# Patient Record
Sex: Female | Born: 1955
Health system: Southern US, Community
[De-identification: ages and names within clinical notes are randomized; demographics above are authoritative.]

## PROBLEM LIST (undated history)

## (undated) DIAGNOSIS — D219 Benign neoplasm of connective and other soft tissue, unspecified: Secondary | ICD-10-CM

## (undated) DIAGNOSIS — E785 Hyperlipidemia, unspecified: Secondary | ICD-10-CM

## (undated) DIAGNOSIS — E669 Obesity, unspecified: Secondary | ICD-10-CM

## (undated) DIAGNOSIS — R87629 Unspecified abnormal cytological findings in specimens from vagina: Secondary | ICD-10-CM

## (undated) DIAGNOSIS — R7303 Prediabetes: Secondary | ICD-10-CM

## (undated) DIAGNOSIS — I10 Essential (primary) hypertension: Secondary | ICD-10-CM

## (undated) DIAGNOSIS — E119 Type 2 diabetes mellitus without complications: Secondary | ICD-10-CM

## (undated) DIAGNOSIS — O24419 Gestational diabetes mellitus in pregnancy, unspecified control: Secondary | ICD-10-CM

## (undated) HISTORY — DX: Gestational diabetes mellitus in pregnancy, unspecified control: O24.419

## (undated) HISTORY — PX: HERNIA REPAIR: SHX51

## (undated) HISTORY — DX: Type 2 diabetes mellitus without complications: E11.9

## (undated) HISTORY — PX: WISDOM TOOTH EXTRACTION: SHX21

## (undated) HISTORY — DX: Obesity, unspecified: E66.9

## (undated) HISTORY — DX: Hyperlipidemia, unspecified: E78.5

## (undated) HISTORY — DX: Benign neoplasm of connective and other soft tissue, unspecified: D21.9

## (undated) HISTORY — DX: Unspecified abnormal cytological findings in specimens from vagina: R87.629

---

## 2002-01-09 ENCOUNTER — Emergency Department (HOSPITAL_COMMUNITY): Admission: EM | Admit: 2002-01-09 | Discharge: 2002-01-09 | Payer: Self-pay | Admitting: Emergency Medicine

## 2003-07-12 ENCOUNTER — Inpatient Hospital Stay (HOSPITAL_COMMUNITY): Admission: AD | Admit: 2003-07-12 | Discharge: 2003-07-12 | Payer: Self-pay | Admitting: Obstetrics and Gynecology

## 2003-11-24 ENCOUNTER — Inpatient Hospital Stay (HOSPITAL_COMMUNITY): Admission: AD | Admit: 2003-11-24 | Discharge: 2003-11-24 | Payer: Self-pay | Admitting: *Deleted

## 2004-09-27 ENCOUNTER — Ambulatory Visit: Payer: Self-pay | Admitting: Family Medicine

## 2005-03-15 ENCOUNTER — Ambulatory Visit (HOSPITAL_COMMUNITY): Admission: RE | Admit: 2005-03-15 | Discharge: 2005-03-15 | Payer: Self-pay | Admitting: Internal Medicine

## 2007-02-18 ENCOUNTER — Encounter (INDEPENDENT_AMBULATORY_CARE_PROVIDER_SITE_OTHER): Payer: Self-pay | Admitting: Specialist

## 2007-02-18 ENCOUNTER — Inpatient Hospital Stay (HOSPITAL_COMMUNITY): Admission: EM | Admit: 2007-02-18 | Discharge: 2007-02-19 | Payer: Self-pay | Admitting: Emergency Medicine

## 2007-07-03 ENCOUNTER — Ambulatory Visit (HOSPITAL_COMMUNITY): Admission: RE | Admit: 2007-07-03 | Discharge: 2007-07-03 | Payer: Self-pay | Admitting: Obstetrics

## 2007-07-08 ENCOUNTER — Emergency Department (HOSPITAL_COMMUNITY): Admission: EM | Admit: 2007-07-08 | Discharge: 2007-07-08 | Payer: Self-pay | Admitting: Emergency Medicine

## 2007-07-25 ENCOUNTER — Inpatient Hospital Stay (HOSPITAL_COMMUNITY): Admission: RE | Admit: 2007-07-25 | Discharge: 2007-07-26 | Payer: Self-pay | Admitting: Surgery

## 2008-08-23 ENCOUNTER — Emergency Department (HOSPITAL_COMMUNITY): Admission: EM | Admit: 2008-08-23 | Discharge: 2008-08-23 | Payer: Self-pay | Admitting: Emergency Medicine

## 2009-07-27 ENCOUNTER — Ambulatory Visit (HOSPITAL_COMMUNITY): Admission: RE | Admit: 2009-07-27 | Discharge: 2009-07-27 | Payer: Self-pay | Admitting: Obstetrics

## 2010-08-03 ENCOUNTER — Ambulatory Visit (HOSPITAL_COMMUNITY): Admission: RE | Admit: 2010-08-03 | Discharge: 2010-08-03 | Payer: Self-pay | Admitting: Obstetrics

## 2011-01-05 ENCOUNTER — Encounter: Payer: Self-pay | Admitting: *Deleted

## 2011-01-05 ENCOUNTER — Emergency Department (HOSPITAL_COMMUNITY)
Admission: EM | Admit: 2011-01-05 | Discharge: 2011-01-05 | Payer: Self-pay | Source: Home / Self Care | Admitting: Emergency Medicine

## 2011-01-06 ENCOUNTER — Encounter: Payer: Self-pay | Admitting: Internal Medicine

## 2011-01-08 LAB — DIFFERENTIAL
Basophils Absolute: 0.1 10*3/uL (ref 0.0–0.1)
Basophils Relative: 1 % (ref 0–1)
Eosinophils Absolute: 0.2 10*3/uL (ref 0.0–0.7)
Eosinophils Relative: 3 % (ref 0–5)
Lymphocytes Relative: 49 % — ABNORMAL HIGH (ref 12–46)
Lymphs Abs: 3.2 10*3/uL (ref 0.7–4.0)
Monocytes Absolute: 0.5 10*3/uL (ref 0.1–1.0)
Monocytes Relative: 8 % (ref 3–12)
Neutro Abs: 2.6 10*3/uL (ref 1.7–7.7)
Neutrophils Relative %: 39 % — ABNORMAL LOW (ref 43–77)

## 2011-01-08 LAB — URINALYSIS, ROUTINE W REFLEX MICROSCOPIC
Bilirubin Urine: NEGATIVE
Hgb urine dipstick: NEGATIVE
Ketones, ur: NEGATIVE mg/dL
Nitrite: NEGATIVE
Protein, ur: NEGATIVE mg/dL
Specific Gravity, Urine: 1.007 (ref 1.005–1.030)
Urine Glucose, Fasting: NEGATIVE mg/dL
Urobilinogen, UA: 0.2 mg/dL (ref 0.0–1.0)
pH: 7 (ref 5.0–8.0)

## 2011-01-08 LAB — POCT PREGNANCY, URINE: Preg Test, Ur: NEGATIVE

## 2011-01-08 LAB — COMPREHENSIVE METABOLIC PANEL
ALT: 15 U/L (ref 0–35)
AST: 21 U/L (ref 0–37)
Albumin: 3.7 g/dL (ref 3.5–5.2)
Alkaline Phosphatase: 51 U/L (ref 39–117)
BUN: 8 mg/dL (ref 6–23)
CO2: 26 mEq/L (ref 19–32)
Calcium: 9.4 mg/dL (ref 8.4–10.5)
Chloride: 101 mEq/L (ref 96–112)
Creatinine, Ser: 0.8 mg/dL (ref 0.4–1.2)
GFR calc Af Amer: >60 mL/min (ref >60)
GFR calc non Af Amer: >60 mL/min (ref >60)
Glucose, Bld: 109 mg/dL — ABNORMAL HIGH (ref 70–99)
Potassium: 4.1 mEq/L (ref 3.5–5.1)
Sodium: 136 mEq/L (ref 135–145)
Total Bilirubin: 0.8 mg/dL (ref 0.3–1.2)
Total Protein: 8.3 g/dL (ref 6.0–8.3)

## 2011-01-08 LAB — CBC
HCT: 38.7 % (ref 36.0–46.0)
Hemoglobin: 13.3 g/dL (ref 12.0–15.0)
MCH: 29.2 pg (ref 26.0–34.0)
MCHC: 34.4 g/dL (ref 30.0–36.0)
MCV: 84.9 fL (ref 78.0–100.0)
Platelets: 317 10*3/uL (ref 150–400)
RBC: 4.56 MIL/uL (ref 3.87–5.11)
RDW: 13.4 % (ref 11.5–15.5)
WBC: 6.6 10*3/uL (ref 4.0–10.5)

## 2011-03-05 ENCOUNTER — Inpatient Hospital Stay (INDEPENDENT_AMBULATORY_CARE_PROVIDER_SITE_OTHER)
Admission: RE | Admit: 2011-03-05 | Discharge: 2011-03-05 | Disposition: A | Discharge status: Home / Self Care | Payer: BC Managed Care – PPO | Source: Ambulatory Visit | Attending: Family Medicine | Admitting: Family Medicine

## 2011-03-05 DIAGNOSIS — R6889 Other general symptoms and signs: Secondary | ICD-10-CM

## 2011-04-30 NOTE — Op Note (Signed)
NAME:  Mackenzie Nichols, Mackenzie Nichols               ACCOUNT NO.:  [id number]   MEDICAL RECORD NO.:  [id number]          PATIENT TYPE:  OIB   LOCATION:  1529                         FACILITY:  [facility]   PHYSICIAN:  Sandria Bales. Ezzard Standing, M.D.  DATE OF BIRTH:  03-19-1956   DATE OF PROCEDURE:  07/24/2007  DATE OF DISCHARGE:                               OPERATIVE REPORT   PREOP DIAGNOSIS:  Incarcerated recurrent ventral hernia.   POSTOP DIAGNOSIS:  Incarcerated recurrent ventral hernia approximately 6  centimeters, Swiss cheese defect.   PROCEDURE:  Laparoscopic repair of ventral hernia with 12 centimeter  circular Parietex mesh.   SURGEON:  Dr. Ezzard Standing.   No first assistant.   ANESTHESIA:  General with about 15 mL of 1% Xylocaine.   COMPLICATIONS:  None.   INDICATIONS FOR PROCEDURE:  Ms. Burnette is an obese 55 year old black  female who presented in March of 2008 with an incarcerated  epigastric/ventral hernia that was repaired in an open fashion.  However, within 3 months of surgery she had a recurrent hernia.  During  her initial surgery because of concern about injury to bowel, I was  unable to place a piece of mesh in and just closed her primarily with  sutures.   She now comes back for a repair of this hernia, this time a laparoscopic  attempt and my plan is to place mesh.   The indications and potential complications explained to the patient.  Potential complications include, but are not limited to, bleeding,  infection, bowel injury, possibly a recurrence of hernia and the  possibility of open hernia surgery.   OPERATIVE NOTE:  The patient placed in the supine position, given a  general endotracheal anesthetic and a time-out was held identifying the  patient and the procedure.   She was given 1 g of Ancef at the initiation of the procedure, she had a  Foley catheter in place, PES stockings in place.   I accessed the abdominal cavity through a left upper quadrant Optiview  going into the  abdominal cavity and placed a 5 mm in the left lower  quadrant and a 5 mm in the right lateral abdominal cavity.  I then did  an abdominal exploration which her abdomen was actually fairly clear,  both lobes of the liver were unremarkable, the stomach unremarkable.  She did have a fibroid appearing uterus.  She did also have incarcerated  abdominal wall hernia with multiple Swiss cheese defects, probably 6 or  8 defects, that covered an area about 6 cm.   I thought this defect could easily be covered with a 12 cm Parietex  mesh.   So, I placed 8 sutures circumferentially on the Parietex mesh, inserted  the mesh into the abdominal cavity and sewed this to the anterior  abdominal wall using Endoclose to catch the suture.  The mesh covered  the entire defect.  I then used the staples, about 25 staples, to staple  on the edge of the mesh at 1 cm intervals and then I put 2 or 3 staples  in the middle of the mesh as a  tenting effect.   At the end of the procedure I took pictures of the mesh, it looked like  the mesh covered the hernia defect well, there were no defects along the  edge of the mesh, there was no bleeding from the omentum that I reduced.  I then removed the trocars in turn.  Then each trocar was closed with a  #4-0 Monocryl suture, each wound painted with Tincture of benzoin and  Steri-stripped.  Again, there were 8 wounds for the sutures for the mesh  and then the three other wounds for the camera ports.   The patient tolerated the procedure well, was transported to the  recovery room in good condition.      Sandria Bales. Ezzard Standing, M.D.  Electronically Signed     DHN/MEDQ  D:  07/24/2007  T:  07/24/2007  Job:  [id number]   cc:   Kari Baars, M.D.  Fax: [phone]

## 2011-04-30 NOTE — Discharge Summary (Signed)
NAME:  Mackenzie Nichols, Mackenzie Nichols               ACCOUNT NO.:  [id number]   MEDICAL RECORD NO.:  [id number]          PATIENT TYPE:  WOC   LOCATION:  WOC                          FACILITY:  [facility]   PHYSICIAN:  Sandria Bales. Ezzard Standing, M.D.  DATE OF BIRTH:  [birth year]   DATE OF ADMISSION:  DATE OF DISCHARGE:                               DISCHARGE SUMMARY   DISCHARGE DIAGNOSES:  1. Incarcerated ventral hernia.  2. Obesity.   OPERATIONS PERFORMED:  Laparoscopic ventral incisional hernia repair on  July 24, 2007.   HISTORY OF PRESENT ILLNESS:  This is a 55 year old black female whose a  patient of Dr. Eric Form who in March 2008 presented with an acute  incarcerated ventral hernia. This hernia was repaired. She had bowel  entrapped in the hernia and therefore I felt uncomfortable placing mesh  in the hernia. Within 3 or 4 months, she has evidence of recurrent  hernia with increasing abdominal mass which is increasingly tender. I  discussed with her about returning to the operating room for a  laparoscopic repair this time with mesh of this hernia.   PAST MEDICAL HISTORY:  Really insignificant except for her weight  problems.   HOSPITAL COURSE:  On July 24, 2007, she presented to the [facility]  operating room where I did a laparoscopic ventral hernia repair for an  incarcerated ventral hernia with a 12 cm circular piece of Parietex  mesh.   Postoperatively she did well. She had some nausea, was not quite ready  to go home the first day and is now  2 days postop. She is doing better,  still though sore. Her lungs were clear to auscultation. Her abdomen had  a few bowel sounds, her pulse was 69.   She will be discharged home.  She can shower later tonight.  She should be on a low fat diet.  Probably ought to stay actually on a light diet or liquids for a day or  two until her appetite returns.  She should not drive for 3-5 days and I have written her out of work  until September 15 which is  approximately 5-6 weeks postop.   She was given Vicodin for pain and can resume any other medicine she is  on.   CONDITION ON DISCHARGE:  Good.      Sandria Bales. Ezzard Standing, M.D.  Electronically Signed     DHN/MEDQ  D:  07/26/2007  T:  07/27/2007  Job:  [id number]   cc:   Kari Baars, M.D.  Fax: [phone]

## 2011-05-03 NOTE — H&P (Signed)
NAME:  Mackenzie Nichols, Mackenzie Nichols               ACCOUNT NO.:  [id number]   MEDICAL RECORD NO.:  [id number]         PATIENT TYPE:  LINP   LOCATION:                               FACILITY:  [facility]   PHYSICIAN:  Mackenzie Nichols, M.D.  DATE OF BIRTH:  1956-03-03   DATE OF ADMISSION:  02/18/2007  DATE OF DISCHARGE:                              HISTORY & PHYSICAL   REASON FOR ADMISSION:  Abdominal pain/hernia.   HISTORY OF PRESENT ILLNESS:  This is a 55 year old black female who is a  patient of Dr. Eric Nichols who was in otherwise good health except for  about a year's time when she has had on and off vomiting, has noticed a  mass in her abdominal wall.  I do not think any further medical  attention has been paid to this mass.  I am not sure how much of her  abdominal complaints she had brought to medical attention.  She does  take some Prilosec for indigestion/reflux.  She has had no history of  peptic ulcer disease, liver disease, pancreatic disease, colon disease.  She has never had an upper or lower endoscopy.  Her only prior abdominal  surgery was a tubal ligation in 1997.   ALLERGIES:  No known drug allergies.   MEDICATIONS:  Prilosec.   REVIEW OF SYSTEMS:  NEUROLOGICAL:  No history of seizures or loss of  consciousness.  PULMONARY:  She does not smoke cigarettes.  No history of pneumonia,  tuberculosis.  CARDIAC:  No history of heart disease, chest pain, hypertension.  She  has had no prior cardiac catheterization.  GASTROINTESTINAL:  See history of present illness.  UROLOGIC:  No history of kidney stones or kidney infections.  MUSCULOSKELETAL:  She has had no orthopedic problems.   SOCIAL HISTORY:  She is married.  Her husband is a Naval architect.  He is  on the road right now.  Her second daughter, Mackenzie Nichols is in the room  with her.  She has six children.  She works at a [employer]   PHYSICAL EXAMINATION:  VITAL SIGNS:  Temperature 97, blood pressure  147/91, pulse 74,  respirations 20.  GENERAL:  Unremarkable.  She is a moderately overweight black female,  but pleasant, without significant symptoms.  HEENT:  Unremarkable.  NECK:  Supple.  No mass or thyromegaly.  LUNGS:  Symmetric breath sounds.  Clear to auscultation.  HEART:  Regular rate and rhythm.  I hear no murmur or rub.  I did not  examine her breasts.  She has had a mammogram within the last year.  ABDOMEN:  She has approximately a 4 cm mass which is about 2-3 cm above  her umbilicus.  This is consistent with an incarcerated hernia.  I feel  no other mass or nodule, but she is somewhat overweight.  She has no  other abdominal scars or organomegaly.  EXTREMITIES:  She has good strength in all four extremities.  NEUROLOGICAL:  Grossly intact.   LABORATORY DATA:  White blood cell count of 10,600, 73% neutrophils,  hemoglobin 13, hematocrit 38.  Urinalysis unremarkable.  Sodium 137,  potassium 3.8, chloride 102, CO2 28, glucose 122, BUN 5.   STUDIES:  CT scan read by Dr. Maryclare Nichols shows an incarcerated epigastric  hernia.  He thought there was bowel in it though I am not entirely sure.  There is also question of a right lower lung nodule.  She will need  follow up CT probably in six months.  I discussed this with her and her  daughter.   IMPRESSION:  1. Incarcerated ventral hernia with possible bowel.  I plan to repair      this hernia.  I discussed with the patient and her daughter      indications and complications of repair of this abdominal wall      hernia.  Potential complications not limited to bleeding,      infection, bowel resection and possible recurrence of the hernia.  We are going to repair this ventral hernia at this time.  1. Questionable right lower lung nodule.  She will need to follow up      in six months.  I discussed with the patient and her daughter and      they understand the need for followup.  2. Moderate obesity.      Mackenzie Nichols, M.D.  Electronically  Signed     DHN/MEDQ  D:  02/18/2007  T:  02/18/2007  Job:  [id number]   cc:   Mackenzie Nichols, M.D.  Fax: [phone]

## 2011-05-03 NOTE — Op Note (Signed)
NAME:  Mackenzie Nichols, Mackenzie Nichols               ACCOUNT NO.:  [id number]   MEDICAL RECORD NO.:  [id number]         PATIENT TYPE:  LINP   LOCATION:                               FACILITY:  [facility]   PHYSICIAN:  Sandria Bales. Ezzard Standing, M.D.  DATE OF BIRTH:  27-Oct-1956   DATE OF PROCEDURE:  02/18/2007  DATE OF DISCHARGE:                               OPERATIVE REPORT   PREOPERATIVE DIAGNOSIS:  Incarcerated epigastric hernia.   POSTOPERATIVE DIAGNOSIS:  Small bowel incarcerated in a ventral  epigastric hernia.  Uterine fibroids.   OPERATION PERFORMED:  Open repair of incarcerated ventral epigastric  hernia.   SURGEON:  Sandria Bales. Ezzard Standing, M.D.   ASSISTANT:  None.   ANESTHESIA:  General endotracheal.   ESTIMATED BLOOD LOSS:  50 mL.   DRAINS:  None.   INDICATIONS FOR PROCEDURE:  [name] is a 55 year old black female who  has had an abdominal mass with on and off pain in her groin almost for  year.  She has presented with a more acute nausea and vomiting and  abdominal pain.  A CT scan suggested incarcerated ventral hernia with  bowel.  She now comes for repair of this hernia.  Indications and  potential complications of hernia repair were explained to the patient  and her daughter.   The potential complications include but not limited to bleeding,  infection, bowel injury and possibility of recurrence of hernia.   DESCRIPTION OF PROCEDURE:  Patient placed in the supine position with  general endotracheal anesthetic as supervised by Jenelle Mages. Rica Mast,  M.D.  She was given 1 g of Ancef at initiation of procedure.  Her  abdomen was prepped with Betadine solution and sterilely draped.  This  hernia was about 2 to 3 cm above the umbilicus so I cut down on the  hernia sac.  I opened the hernia sac.  She does have a knuckle of small  bowel incarcerated in the hernia which I reduced.   The bowel, though beat up, was viable and did not require any resection.  She also had some omentum stuck up beside  this hernia and actually it  was a small umbilical hernia.  I took the umbilical skin off the  umbilical hernia.  The hernia defect was about 2 cm.  Because of the  questionability of the bowel, I was a little hesitant to put a piece of  mesh in.  Therefore, I just closed this primarily with interrupted #1  Novofil sutures.  I used about six of these in a transverse closure.   I then infiltrated the fascia and skin with about 20 mL of 0.25%  Marcaine.  I closed the subcutaneous tissues with 3-0 Vicryl suture and  the skin with a 5-0 Monocryl suture, painted the wounds with tincture of  benzoin and steri-stripped.  Sponge and needle count were correct at the  end of the case.   Also of note, the patient had a large uterus with multiple fibroids  which were palpable but appeared to be entirely benign.      Sandria Bales. Ezzard Standing, M.D.  Electronically  Signed     DHN/MEDQ  D:  02/18/2007  T:  02/18/2007  Job:  [id number]   cc:   Kari Baars, M.D.  Fax: [phone]

## 2011-08-15 ENCOUNTER — Other Ambulatory Visit: Payer: Self-pay | Admitting: Obstetrics

## 2011-08-15 DIAGNOSIS — Z1231 Encounter for screening mammogram for malignant neoplasm of breast: Secondary | ICD-10-CM

## 2011-08-29 ENCOUNTER — Ambulatory Visit (HOSPITAL_COMMUNITY): Payer: No Typology Code available for payment source | Attending: Obstetrics

## 2011-09-30 LAB — DIFFERENTIAL
Basophils Absolute: 0.1
Basophils Absolute: 0.1
Basophils Relative: 1
Eosinophils Absolute: 0.2
Eosinophils Relative: 2
Lymphs Abs: 3
Monocytes Absolute: 0.6
Neutrophils Relative %: 65

## 2011-09-30 LAB — COMPREHENSIVE METABOLIC PANEL
ALT: 14
AST: 16
Albumin: 3.3 — ABNORMAL LOW
Alkaline Phosphatase: 51
BUN: 7
CO2: 29
Calcium: 9.1
Chloride: 106
Creatinine, Ser: 0.76
GFR calc Af Amer: >60
GFR calc non Af Amer: >60
Glucose, Bld: 108 — ABNORMAL HIGH
Potassium: 4.1
Sodium: 141
Total Bilirubin: 0.2 — ABNORMAL LOW
Total Protein: 7.5

## 2011-09-30 LAB — BASIC METABOLIC PANEL
BUN: 11
Creatinine, Ser: 0.76
GFR calc non Af Amer: >60

## 2011-09-30 LAB — URINALYSIS, ROUTINE W REFLEX MICROSCOPIC
Glucose, UA: NEGATIVE
Leukocytes, UA: NEGATIVE
Nitrite: NEGATIVE
Protein, ur: NEGATIVE
Urobilinogen, UA: 0.2

## 2011-09-30 LAB — CBC
HCT: 36.5
Hemoglobin: 12.5
MCHC: 34.3
MCV: 84.9
MCV: 85.3
Platelets: 391
Platelets: 398
RBC: 4.3
RDW: 12.3
WBC: 8.7
WBC: 11.3 — ABNORMAL HIGH

## 2011-09-30 LAB — URINE MICROSCOPIC-ADD ON

## 2011-09-30 LAB — PREGNANCY, URINE: Preg Test, Ur: NEGATIVE

## 2011-11-26 ENCOUNTER — Ambulatory Visit (HOSPITAL_COMMUNITY): Payer: Self-pay | Attending: Obstetrics

## 2011-12-23 ENCOUNTER — Ambulatory Visit (HOSPITAL_COMMUNITY): Payer: BC Managed Care – PPO

## 2012-01-17 ENCOUNTER — Ambulatory Visit (HOSPITAL_COMMUNITY)
Admission: RE | Admit: 2012-01-17 | Discharge: 2012-01-17 | Disposition: A | Discharge status: Home / Self Care | Payer: BC Managed Care – PPO | Source: Ambulatory Visit | Attending: Obstetrics | Admitting: Obstetrics

## 2012-01-17 DIAGNOSIS — Z1231 Encounter for screening mammogram for malignant neoplasm of breast: Secondary | ICD-10-CM | POA: Insufficient documentation

## 2012-09-16 ENCOUNTER — Ambulatory Visit (INDEPENDENT_AMBULATORY_CARE_PROVIDER_SITE_OTHER): Payer: PRIVATE HEALTH INSURANCE | Admitting: Family Medicine

## 2012-09-16 ENCOUNTER — Encounter: Payer: Self-pay | Admitting: Family Medicine

## 2012-09-16 VITALS — BP 134/72 | HR 66 | Temp 98.2°F | Ht 62.0 in | Wt 238.0 lb

## 2012-09-16 DIAGNOSIS — E669 Obesity, unspecified: Secondary | ICD-10-CM

## 2012-09-16 DIAGNOSIS — Z6841 Body Mass Index (BMI) 40.0 and over, adult: Secondary | ICD-10-CM

## 2012-09-16 LAB — COMPREHENSIVE METABOLIC PANEL
BUN: 11 mg/dL (ref 6–23)
CO2: 27 mEq/L (ref 19–32)
Creat: 0.8 mg/dL (ref 0.50–1.10)
Glucose, Bld: 101 mg/dL — ABNORMAL HIGH (ref 70–99)
Total Bilirubin: 0.5 mg/dL (ref 0.3–1.2)

## 2012-09-16 LAB — CBC
HCT: 35.9 % — ABNORMAL LOW (ref 36.0–46.0)
MCH: 29.1 pg (ref 26.0–34.0)
MCV: 83.5 fL (ref 78.0–100.0)
Platelets: 371 10*3/uL (ref 150–400)
RDW: 14.4 % (ref 11.5–15.5)

## 2012-09-16 LAB — LIPID PANEL
Cholesterol: 244 mg/dL — ABNORMAL HIGH (ref 0–200)
HDL: 48 mg/dL (ref >39)
Total CHOL/HDL Ratio: 5.1 Ratio
Triglycerides: 93 mg/dL (ref <150)
VLDL: 19 mg/dL (ref 0–40)

## 2012-09-16 NOTE — Patient Instructions (Addendum)
It has been a pleasure to see you today. I will call you with the labs results if they come back abnormal  Remember what we have talked about diet and exercise. Make your next appointment in one month

## 2012-09-17 ENCOUNTER — Encounter: Payer: Self-pay | Admitting: Family Medicine

## 2012-09-17 DIAGNOSIS — Z6841 Body Mass Index (BMI) 40.0 and over, adult: Secondary | ICD-10-CM | POA: Insufficient documentation

## 2012-09-17 NOTE — Progress Notes (Signed)
  Subjective:    Patient ID: Mackenzie Nichols, female    DOB: 1956/03/16, 56 y.o.   MRN: [id number]  HPI Pt comes today to establish care in our clinic. She has no current complaints but is open to talk about her obesity. She has been obese for a long time and currently not trying to lose weight. Her diet is not "the healthiest" as she states. She also does not exercise on regular bases but tries to keep herself active at work and at home. She does not take any medications but multivitamins every other day. Denies history of tobacco, alcohol or drugs abuse.  Woman's health: LMP 04/2012. No symptoms of menopause. Pt sexually active with no birth control plans. Declines talking about the topic.  Health maintenance: Last pap smear was this year and it was done at Dr. Frederik Pear office with negative results per pt's report. Denies Flu shot and Tdap at this time.   Review of Systems Denies SOB, chest pain, palpitations, headaches, dizziness, numbness, tingling or weakness.     Objective:   Physical Exam Gen:  NAD morbidly obese pt. HEENT: Moist mucous membranes.  CV: Regular rate and rhythm, no murmurs rubs or gallops PULM: Clear to auscultation bilaterally. No wheezes/rales/rhonchi ABD: Soft, non tender, normal bowel sounds. EXT: No edema Neuro: Alert and oriented x3. No focalization      Assessment & Plan:

## 2012-09-17 NOTE — Assessment & Plan Note (Addendum)
Pt very open and positive about life style modifications in order to lose weight. Plan: Will obtain cmet, cbc, tsh and lipid profile to evaluate pt for metabolic syndrome. Life style modifications. Discussed healthy diet and exercise regimen.  Goal for now is to maintain weight and do not continue increasing. Will focus on losing weight once this is achieved. BP borderline elevated. Instructed pt to keep a log and will reassess during next visit. F/u in a month.

## 2013-01-18 ENCOUNTER — Other Ambulatory Visit: Payer: Self-pay | Admitting: Obstetrics

## 2013-01-18 DIAGNOSIS — Z1231 Encounter for screening mammogram for malignant neoplasm of breast: Secondary | ICD-10-CM

## 2013-01-27 ENCOUNTER — Ambulatory Visit (HOSPITAL_COMMUNITY): Payer: Medicaid Other

## 2013-02-05 ENCOUNTER — Ambulatory Visit (HOSPITAL_COMMUNITY)
Admission: RE | Admit: 2013-02-05 | Discharge: 2013-02-05 | Disposition: A | Discharge status: Home / Self Care | Payer: Medicaid Other | Source: Ambulatory Visit | Attending: Obstetrics | Admitting: Obstetrics

## 2013-02-05 DIAGNOSIS — Z1231 Encounter for screening mammogram for malignant neoplasm of breast: Secondary | ICD-10-CM | POA: Insufficient documentation

## 2013-06-09 ENCOUNTER — Ambulatory Visit: Payer: No Typology Code available for payment source | Admitting: Family Medicine

## 2013-06-09 ENCOUNTER — Telehealth: Payer: Self-pay | Admitting: *Deleted

## 2013-06-09 NOTE — Telephone Encounter (Signed)
Pt called and appointment rescheduled for July 2 at 4:15 Wyatt Haste, RN-BSN

## 2013-06-16 ENCOUNTER — Ambulatory Visit (INDEPENDENT_AMBULATORY_CARE_PROVIDER_SITE_OTHER): Payer: PRIVATE HEALTH INSURANCE | Admitting: Family Medicine

## 2013-06-16 ENCOUNTER — Encounter: Payer: Self-pay | Admitting: Family Medicine

## 2013-06-16 VITALS — BP 136/86 | HR 84 | Temp 98.3°F | Wt 239.0 lb

## 2013-06-16 DIAGNOSIS — E7800 Pure hypercholesterolemia, unspecified: Secondary | ICD-10-CM

## 2013-06-16 DIAGNOSIS — N926 Irregular menstruation, unspecified: Secondary | ICD-10-CM

## 2013-06-16 DIAGNOSIS — N939 Abnormal uterine and vaginal bleeding, unspecified: Secondary | ICD-10-CM

## 2013-06-16 DIAGNOSIS — R739 Hyperglycemia, unspecified: Secondary | ICD-10-CM | POA: Insufficient documentation

## 2013-06-16 DIAGNOSIS — Z6841 Body Mass Index (BMI) 40.0 and over, adult: Secondary | ICD-10-CM

## 2013-06-16 DIAGNOSIS — R7309 Other abnormal glucose: Secondary | ICD-10-CM

## 2013-06-16 NOTE — Assessment & Plan Note (Signed)
Fasting glucose 101. P/ A1C

## 2013-06-16 NOTE — Assessment & Plan Note (Addendum)
AUB in postmenopausal pt with increased in uterine size. P/ Discussed with pt in depth causes and questions answered. Endometrial biopsy (discussed with pt and recommended to make an appointment for this procedure; at that point pap smear will be obtained as well per pt's preference) Pelvic/transvaginal ultrasound indicted to evaluate endometrial stripe and uterine morphology since size is a concern.

## 2013-06-16 NOTE — Assessment & Plan Note (Signed)
Same weight. Pt reports hx of thyroid issues. TSH last year was wnl. Will repeat with there rest of her labs. Concern for metabolic syndrome. Will continue to f/u.

## 2013-06-16 NOTE — Assessment & Plan Note (Addendum)
Per labs obtained in October last year. Pt obese. AA. No other risk factors. ASCVD risk is 5.4 pt also with mild hyperglycemia but no DM diagnosed yet. P/ No indicated statins yet Recommended life style modifications. Will check A1C

## 2013-06-16 NOTE — Progress Notes (Signed)
Family Medicine Office Visit Note   Subjective:   Patient ID: Mackenzie Nichols, female  DOB: Nov 11, 1956, 57 y.o.. MRN: [id number]   Pt that comes today for annual exam. She was last seen in October/2013 for her first appointment, was instructed to f/u in a month which she never did. Today she complaints of abnormal vaginal bleeding happening the 2nd week of last month. Her LMP was reported to be in May 2013. Since then pt denies any episodic vaginal spotting/bleeding until this time. Her last vag bleeding lasted for 4-5 days and it was reported to be very heavy. Pt mentions she had a Pelvic U/S done 3 years ago that showed 7 fibroids and Dr. Quintella Reichert recommended her to f/u if symptomatic.   She also reports having "thyroid problems" in the past but has never been on a medication for it. Denies SOB, chest pain, palpitations, headaches, dizziness, numbness or weakness. No changes on urinary or BM habits. No unintentional weigh loss.  Review of Systems:  Per HPI   Objective:   Physical Exam: Gen:  Obese pt NAD HEENT: Moist mucous membranes  CV: Regular rate and rhythm, no murmurs rubs or gallops PULM: Clear to auscultation bilaterally. No wheezes/rales/rhonchi ABD: Soft, non tender, non distended, normal bowel sounds. EXT: No edema Neuro: Alert and oriented x3. No  Vulva and perianal area: Normal  Bimanual exam: Uterus anteverted, increased in size (up to umbilicus). No adnexal masses palpated. No cervical motion tenderness.  Assessment & Plan:

## 2013-06-16 NOTE — Patient Instructions (Addendum)
Abnormal Uterine Bleeding Abnormal uterine bleeding can have many causes. Some cases are simply treated, while others are more serious. There are several kinds of bleeding that is considered abnormal, including:  Bleeding between periods.  Bleeding after sexual intercourse.  Spotting anytime in the menstrual cycle.  Bleeding heavier or more than normal.  Bleeding after menopause. CAUSES  There are many causes of abnormal uterine bleeding. It can be present in teenagers, pregnant women, women during their reproductive years, and women who have reached menopause. Your caregiver will look for the more common causes depending on your age, signs, symptoms and your particular circumstance. Most cases are not serious and can be treated. Even the more serious causes, like cancer of the female organs, can be treated adequately if found in the early stages. That is why all types of bleeding should be evaluated and treated as soon as possible. DIAGNOSIS  Diagnosing the cause may take several kinds of tests. Your caregiver may:  Take a complete history of the type of bleeding.  Perform a complete physical exam and Pap smear.  Take an ultrasound on the abdomen showing a picture of the female organs and the pelvis.  Inject dye into the uterus and Fallopian tubes and X-ray them (hysterosalpingogram).  Place fluid in the uterus and do an ultrasound (sonohysterogrqphy).  Take a CT scan to examine the female organs and pelvis.  Take an MRI to examine the female organs and pelvis. There is no X-ray involved with this procedure.  Look inside the uterus with a telescope that has a light at the end (hysteroscopy).  Scrap the inside of the uterus to get tissue to examine (Dilatation and Curettage, D&C).  Look into the pelvis with a telescope that has a light at the end (laparoscopy). This is done through a very small cut (incision) in the abdomen. TREATMENT  Treatment will depend on the cause of the  abnormal bleeding. It can include:  Doing nothing to allow the problem to take care of itself over time.  Hormone treatment.  Birth control pills.  Treating the medical condition causing the problem.  Laparoscopy.  Major or minor surgery  Destroying the lining of the uterus with electrical currant, laser, freezing or heat (uterine ablation). HOME CARE INSTRUCTIONS   Follow your caregiver's recommendation on how to treat your problem.  See your caregiver if you missed a menstrual period and think you may be pregnant.  If you are bleeding heavily, count the number of pads/tampons you use and how often you have to change them. Tell this to your caregiver.  Avoid sexual intercourse until the problem is controlled. SEEK MEDICAL CARE IF:   You have any kind of abnormal bleeding mentioned above.  You feel dizzy at times.  You are 57 years old and have not had a menstrual period yet. SEEK IMMEDIATE MEDICAL CARE IF:   You pass out.  You are changing pads/tampons every 15 to 30 minutes.  You have belly (abdominal) pain.  You have a temperature of 100 F (37.8 C) or higher.  You become sweaty or weak.  You are passing large blood clots from the vagina.  You start to feel sick to your stomach (nauseous) and throw up (vomit). Document Released: 12/02/2005 Document Revised: 02/24/2012 Document Reviewed: 04/27/2009 [facility] Patient Information 2014 [place], [place].  It has been a pleasure to see you today. Please make an appointment here with me for endometrial  Biopsy, at that point we will do the pap smear as  we agreed on. Also I have placed orders for labs for you. Make a lab appointment and come in fasting that morning. I have placed an order for pelvic ultrasound. You will receive a call with date of appointment.

## 2013-06-17 ENCOUNTER — Telehealth: Payer: Self-pay | Admitting: *Deleted

## 2013-06-17 NOTE — Telephone Encounter (Signed)
LMOVM for pt to return call.  Please inform of the below:  Ultrasound appt details: Date: 06/24/13 @ 10:45am Location: [facility] ultrasound depatrtment Instructions:  Please drink 32oz of water 1 hour before your appt. DO NOT go to the restroom.  You must have a full bladder for this procedure. Megha Agnes, Maryjo Rochester

## 2013-06-24 ENCOUNTER — Ambulatory Visit (HOSPITAL_COMMUNITY)
Admission: RE | Admit: 2013-06-24 | Discharge: 2013-06-24 | Disposition: A | Discharge status: Home / Self Care | Payer: Medicaid Other | Source: Ambulatory Visit | Attending: Family Medicine | Admitting: Family Medicine

## 2013-06-24 DIAGNOSIS — D259 Leiomyoma of uterus, unspecified: Secondary | ICD-10-CM | POA: Insufficient documentation

## 2013-06-24 DIAGNOSIS — N852 Hypertrophy of uterus: Secondary | ICD-10-CM | POA: Insufficient documentation

## 2013-06-24 DIAGNOSIS — N939 Abnormal uterine and vaginal bleeding, unspecified: Secondary | ICD-10-CM

## 2013-06-24 DIAGNOSIS — N950 Postmenopausal bleeding: Secondary | ICD-10-CM | POA: Insufficient documentation

## 2013-06-25 ENCOUNTER — Other Ambulatory Visit (INDEPENDENT_AMBULATORY_CARE_PROVIDER_SITE_OTHER): Payer: PRIVATE HEALTH INSURANCE

## 2013-06-25 DIAGNOSIS — R7309 Other abnormal glucose: Secondary | ICD-10-CM

## 2013-06-25 DIAGNOSIS — R739 Hyperglycemia, unspecified: Secondary | ICD-10-CM

## 2013-07-06 ENCOUNTER — Encounter: Payer: Self-pay | Admitting: Family Medicine

## 2013-07-06 ENCOUNTER — Other Ambulatory Visit (HOSPITAL_COMMUNITY)
Admission: RE | Admit: 2013-07-06 | Discharge: 2013-07-06 | Disposition: A | Discharge status: Home / Self Care | Payer: Medicaid Other | Source: Ambulatory Visit | Attending: Family Medicine | Admitting: Family Medicine

## 2013-07-06 ENCOUNTER — Ambulatory Visit (INDEPENDENT_AMBULATORY_CARE_PROVIDER_SITE_OTHER): Payer: Medicaid Other | Admitting: Family Medicine

## 2013-07-06 VITALS — BP 144/79 | HR 104 | Temp 98.1°F | Ht 62.0 in | Wt 235.3 lb

## 2013-07-06 DIAGNOSIS — R739 Hyperglycemia, unspecified: Secondary | ICD-10-CM

## 2013-07-06 DIAGNOSIS — N938 Other specified abnormal uterine and vaginal bleeding: Secondary | ICD-10-CM

## 2013-07-06 DIAGNOSIS — Z1151 Encounter for screening for human papillomavirus (HPV): Secondary | ICD-10-CM | POA: Insufficient documentation

## 2013-07-06 DIAGNOSIS — N926 Irregular menstruation, unspecified: Secondary | ICD-10-CM

## 2013-07-06 DIAGNOSIS — N939 Abnormal uterine and vaginal bleeding, unspecified: Secondary | ICD-10-CM

## 2013-07-06 DIAGNOSIS — R7309 Other abnormal glucose: Secondary | ICD-10-CM

## 2013-07-06 DIAGNOSIS — Z124 Encounter for screening for malignant neoplasm of cervix: Secondary | ICD-10-CM

## 2013-07-06 DIAGNOSIS — Z01419 Encounter for gynecological examination (general) (routine) without abnormal findings: Secondary | ICD-10-CM | POA: Insufficient documentation

## 2013-07-06 DIAGNOSIS — N949 Unspecified condition associated with female genital organs and menstrual cycle: Secondary | ICD-10-CM

## 2013-07-06 NOTE — Progress Notes (Signed)
Family Medicine Office Visit Note   Subjective:   Patient ID: Mackenzie Nichols, female  DOB: 01-26-1956, 57 y.o.. MRN: [id number]   Pt come today to review labs and follow up her recent DUB. She denies any more vaginal bleeding that the one described on her prior visit.  U/S and chemistry was reviewed and discussed with pt. Pap smear was obtained today.  Review of Systems:  Pt denies SOB, chest pain, palpitations, headaches, dizziness, numbness or weakness. No changes on urinary or BM habits. No unintentional weigh loss/gain.  Objective:   Physical Exam: Gen:  NAD HEENT: Moist mucous membranes  CV: Regular rate and rhythm, no murmurs rubs or gallops PULM: Clear to auscultation bilaterally. No wheezes/rales/rhonchi ABD: Soft, non tender, non distended, normal bowel sound EXT: No edema Neuro: Alert and oriented x3. No focalization Vulva and perianal area: Normal  Speculum: Vagina and cervix of normal appearance, no friability, no discharge. Bimanual exam: Uterus increased in size unchanged from prior visit's exam. No cervical motion tenderness.  Assessment & Plan:

## 2013-07-06 NOTE — Assessment & Plan Note (Addendum)
A1C at 6.3. High risk for future DM. Diet and exercise regimen discussed. Pt positive to make life style modifications. F/u in 3-4 months.

## 2013-07-06 NOTE — Assessment & Plan Note (Signed)
Multiple fibromas per ultrasound. Discussed with attending Dr. Jennette Kettle. We were planning for endometrial biopsy but with this result pt needs GYN evaluation and most likely surgical treatment. Pt has not have any more bleeding since her last appointment here.  She reports has seen Dr. Quintella Reichert in the past but she owes money to his practice so she will try to resolved this financial issues with his practice first. Pt now on Medicaid that will end in 1 week. Recommended to apply for [employer] and instructed to get paperwork needed to complete.  Close follow up. Discussed signs of worsening condition that should prompt re-evaluation.

## 2013-07-06 NOTE — Patient Instructions (Addendum)
It has been a pleasure to see you today.' A referral for Gynecology has been placed today DASH Diet The DASH diet stands for "Dietary Approaches to Stop Hypertension." It is a healthy eating plan that has been shown to reduce high blood pressure (hypertension) in as little as 14 days, while also possibly providing other significant health benefits. These other health benefits include reducing the risk of breast cancer after menopause and reducing the risk of type 2 diabetes, heart disease, colon cancer, and stroke. Health benefits also include weight loss and slowing kidney failure in patients with chronic kidney disease.  DIET GUIDELINES  Limit salt (sodium). Your diet should contain less than 1500 mg of sodium daily.  Limit refined or processed carbohydrates. Your diet should include mostly whole grains. Desserts and added sugars should be used sparingly.  Include small amounts of heart-healthy fats. These types of fats include nuts, oils, and tub margarine. Limit saturated and trans fats. These fats have been shown to be harmful in the body. CHOOSING FOODS  The following food groups are based on a 2000 calorie diet. See your Registered Dietitian for individual calorie needs. Grains and Grain Products (6 to 8 servings daily)  Eat More Often: Whole-wheat bread, brown rice, whole-grain or wheat pasta, quinoa, popcorn without added fat or salt (air popped).  Eat Less Often: White bread, white pasta, white rice, cornbread. Vegetables (4 to 5 servings daily)  Eat More Often: Fresh, frozen, and canned vegetables. Vegetables may be raw, steamed, roasted, or grilled with a minimal amount of fat.  Eat Less Often/Avoid: Creamed or fried vegetables. Vegetables in a cheese sauce. Fruit (4 to 5 servings daily)  Eat More Often: All fresh, canned (in natural juice), or frozen fruits. Dried fruits without added sugar. One hundred percent fruit juice ( cup [237 mL] daily).  Eat Less Often: Dried fruits  with added sugar. Canned fruit in light or heavy syrup. [employer], Fish, and Poultry (2 servings or less daily. One serving is 3 to 4 oz [85-114 g]).  Eat More Often: Ninety percent or leaner ground beef, tenderloin, sirloin. Round cuts of beef, chicken breast, [place] breast. All fish. Grill, bake, or broil your meat. Nothing should be fried.  Eat Less Often/Avoid: Fatty cuts of meat, [place], or chicken leg, thigh, or wing. Fried cuts of meat or fish. Dairy (2 to 3 servings)  Eat More Often: Low-fat or fat-free milk, low-fat plain or light yogurt, reduced-fat or part-skim cheese.  Eat Less Often/Avoid: Milk (whole, 2%).Whole milk yogurt. Full-fat cheeses. Nuts, Seeds, and Legumes (4 to 5 servings per week)  Eat More Often: All without added salt.  Eat Less Often/Avoid: Salted nuts and seeds, canned beans with added salt. Fats and Sweets (limited)  Eat More Often: Vegetable oils, tub margarines without trans fats, sugar-free gelatin. Mayonnaise and salad dressings.  Eat Less Often/Avoid: Coconut oils, palm oils, butter, stick margarine, cream, half and half, cookies, candy, pie. FOR MORE INFORMATION The Dash Diet Eating Plan: www.dashdiet.org Document Released: 11/21/2011 Document Revised: 02/24/2012 Document Reviewed: 11/21/2011 [facility] Patient Information 2014 [place], [place].

## 2013-07-12 ENCOUNTER — Telehealth: Payer: Self-pay | Admitting: Family Medicine

## 2013-07-12 NOTE — Telephone Encounter (Signed)
Pt requesting gyn ref since insurance has been re- stated.Mackenzie Nichols, Mackenzie Nichols

## 2013-07-12 NOTE — Telephone Encounter (Signed)
Patient says she needs to speak directly with Dr. Aviva Signs about going to see the Dermatologist.

## 2013-07-22 ENCOUNTER — Telehealth: Payer: Self-pay | Admitting: Family Medicine

## 2013-07-22 NOTE — Telephone Encounter (Signed)
Patient is calling to check the status of the referral for her to go to [facility] for her Fibroids.

## 2013-07-23 NOTE — Telephone Encounter (Signed)
Called pt and informed that she may call [phone] for appointment - pt verbalized understanding. Wyatt Haste, RN-BSN

## 2013-08-03 ENCOUNTER — Telehealth: Payer: Self-pay | Admitting: Family Medicine

## 2013-08-03 NOTE — Telephone Encounter (Signed)
Pt is returning Dr. Willaim Rayas call. I didn't see any message to be able to give her information. JW

## 2013-08-03 NOTE — Telephone Encounter (Signed)
Will forward to Dr Piloto 

## 2013-08-03 NOTE — Telephone Encounter (Signed)
Has this been addressed?

## 2013-08-03 NOTE — Telephone Encounter (Signed)
Left message with daughter to return call regarding gyn appointment - IF PT RETURNS CALL: pt can set up gyn appointment on her own and they will call us for NPI # if she is already established. She must come to clinic for office visit in order to get derm. Referral however. Wyatt Haste, RN-BSN

## 2013-08-10 NOTE — Telephone Encounter (Signed)
Returned call to pt - very confused - pt has called and made appointment with gyn for sept 17th - DOES NOT need referral for DERMOTOLOGIST. Referred any further questions regarding gyn visit to that office. Pt verbalized understanding - no need for MD to call pt. Wyatt Haste, RN-BSN

## 2013-08-10 NOTE — Telephone Encounter (Signed)
Patient calls again, states she still has not heard anything back from Dr. Aviva Signs. States she will call again tomorrow.

## 2013-09-01 ENCOUNTER — Other Ambulatory Visit (HOSPITAL_COMMUNITY)
Admission: RE | Admit: 2013-09-01 | Payer: PRIVATE HEALTH INSURANCE | Source: Ambulatory Visit | Admitting: Obstetrics & Gynecology

## 2013-09-01 ENCOUNTER — Encounter: Payer: Self-pay | Admitting: Obstetrics & Gynecology

## 2013-09-01 ENCOUNTER — Encounter: Payer: Self-pay | Admitting: *Deleted

## 2013-09-01 ENCOUNTER — Ambulatory Visit (INDEPENDENT_AMBULATORY_CARE_PROVIDER_SITE_OTHER): Payer: Self-pay | Admitting: Obstetrics & Gynecology

## 2013-09-01 VITALS — BP 126/81 | HR 77 | Ht 61.0 in | Wt 239.9 lb

## 2013-09-01 DIAGNOSIS — N949 Unspecified condition associated with female genital organs and menstrual cycle: Secondary | ICD-10-CM | POA: Insufficient documentation

## 2013-09-01 DIAGNOSIS — D259 Leiomyoma of uterus, unspecified: Secondary | ICD-10-CM

## 2013-09-01 DIAGNOSIS — N938 Other specified abnormal uterine and vaginal bleeding: Secondary | ICD-10-CM | POA: Insufficient documentation

## 2013-09-01 DIAGNOSIS — N950 Postmenopausal bleeding: Secondary | ICD-10-CM

## 2013-09-01 NOTE — Patient Instructions (Signed)
Uterine Fibroid A uterine fibroid is a growth (tumor) that occurs in a woman's uterus. This type of tumor is not cancerous and does not spread out of the uterus. A woman can have one or many fibroids, and the fiboid(s) can become quite large. A fibroid can vary in size, weight, and where it grows in the uterus. Most fibroids do not require medical treatment, but some can cause pain or heavy bleeding during and between periods. CAUSES  A fibroid is the result of a single uterine cell that keeps growing (unregulated), which is different than most cells in the human body. Most cells have a control mechanism that keeps them from reproducing without control.  SYMPTOMS   Bleeding.  Pelvic pain and pressure.  Bladder problems due to the size of the fibroid.  Infertility and miscarriages depending on the size and location of the fibroid. DIAGNOSIS  A diagnosis is made by physical exam. Your caregiver may feel the lumpy tumors during a pelvic exam. Important information regarding size, location, and number of tumors can be gained by having an ultrasound. It is rare that other tests, such as a CT scan or MRI, are needed. TREATMENT   Your caregiver may recommend watchful waiting. This involves getting the fibroid checked by your caregiver to see if the fibroids grow or shrink.   Hormonal treatment or an intrauterine device (IUD) may be prescribed.   Surgery may be needed to remove the fibroids (myomectomy) or the uterus (hysterectomy). This depends on your situation. When fibroids interfere with fertility and a woman wants to become pregnant, a caregiver may recommend having the fibroids removed.  HOME CARE INSTRUCTIONS  Home care depends on how you were treated. In general:   Keep all follow-up appointments with your caregiver.   Only take medicine as told by your caregiver. Do not take aspirin. It can cause bleeding.   If you have excessive periods and soak tampons or pads in a half hour or  less, contact your caregiver immediately. If your periods are troublesome but not so heavy, lie down with your feet raised slightly above your heart. Place cold packs on your lower abdomen.   If your periods are heavy, write down the number of pads or tampons you use per month. Bring this information to your caregiver.   Talk to your caregiver about taking iron pills.   Include green vegetables in your diet.   If you were prescribed a hormonal treatment, take the hormonal medicines as directed.   If you need surgery, ask your caregiver for information on your specific surgery.  SEEK IMMEDIATE MEDICAL CARE IF:  You have pelvic pain or cramps not controlled with medicines.   You have a sudden increase in pelvic pain.   You have an increase of bleeding between and during periods.   You feel lightheaded or have fainting episodes.  MAKE SURE YOU:  Understand these instructions.  Will watch your condition.  Will get help right away if you are not doing well or get worse. Document Released: 11/29/2000 Document Revised: 02/24/2012 Document Reviewed: 12/23/2011 [employer] Patient Information 2014 [employer].  

## 2013-09-01 NOTE — Progress Notes (Signed)
Patient ID: Mackenzie Nichols, female   DOB: January 29, 1956, 57 y.o.   MRN: [id number]  Chief Complaint  Patient presents with  . Fibroids    HPI Mackenzie Nichols is a 57 y.o. female.  [id number] Patient's last menstrual period was 06/15/2013. Prior to 06/2013 no menses for 1 year. Period lasted <7 days  HPI  History reviewed. No pertinent past medical history.  History reviewed. No pertinent past surgical history.  History reviewed. No pertinent family history.  Social History History  Substance Use Topics  . Smoking status: Never Smoker   . Smokeless tobacco: Not on file  . Alcohol Use: No    No Known Allergies  No current outpatient prescriptions on file.   No current facility-administered medications for this visit.    Review of Systems Review of Systems  Constitutional: Negative for fever.  Gastrointestinal: Negative for abdominal distention.  Genitourinary: Negative for vaginal bleeding, vaginal discharge and pelvic pain.    Blood pressure 126/81, pulse 77, height 5\' 1"  (1.549 m), weight 239 lb 14.4 oz (108.818 kg), last menstrual period 06/15/2013.  Physical Exam Physical Exam  Vitals reviewed. Constitutional: She is oriented to person, place, and time. She appears well-developed. No distress.  obese  Pulmonary/Chest: Effort normal and breath sounds normal.  Abdominal: Soft. She exhibits no mass. There is no tenderness.  Genitourinary: Vagina normal. No vaginal discharge found.  Uterus irregular, 10 weeks size, not tender  Neurological: She is alert and oriented to person, place, and time.  Psychiatric: She has a normal mood and affect. Her behavior is normal.    Data Reviewed Study Result    *RADIOLOGY REPORT*  Clinical Data: History of fibroids. Postmenopausal bleeding  TRANSABDOMINAL AND TRANSVAGINAL ULTRASOUND OF PELVIS  Technique: Both transabdominal and transvaginal ultrasound  examinations of the pelvis were performed. Transabdominal technique  was  performed for global imaging of the pelvis including uterus,  ovaries, adnexal regions, and pelvic cul-de-sac.  It was necessary to proceed with endovaginal exam following the  transabdominal exam to visualize the adnexa.  Comparison: 2010  Findings:  Uterus: Is enlarged with a sagittal length of 12.5 cm, depth of  approximately 7 cm and width of approximately 8 mm. Multiple  fibroids are noted. The largest fibroids have sizes and locations  as follows: In the posterior lower uterine segment measuring 6.8 x  5.1 x 5.2 cm, in the posterior upper uterine segment measuring 6.1  x 5.1 x 6.0 cm, in the fundal region measuring 4.9 x 3.3 x 3.7 cm  with a partial subserosal component, in the left fundal region with  a partial subserosal component measuring 4.2 x 4.4 x 4.3 cm and in  the fundus with a small sub serosal component measuring 3.9 x 3.2 x  4.7 cm. More diffuse fibroid involvement is suspected.  Endometrium: Is deviated anteriorly by the posterior lower uterine  segment fibroid. The endometrium cannot be fully visualized today  due to the presence of the fibroid load  Right ovary: Is not seen with confidence either transabdominally  or endovaginally  Left ovary: Is not seen with confidence either transabdominally or  endovaginally  Other findings: No pelvic fluid is seen.  IMPRESSION:  Fibroid uterus with dominant fibroids having sizes and locations as  noted above. More diffuse fibroid involvement is felt to be  present.  Poorly assessed endometrial lining.  Non-visualized ovaries.  Original Report Authenticated By: Rhodia Albright, M.D.    Patient given informed consent, signed copy in the chart, time  out was performed. Appropriate time out taken. . The patient was placed in the lithotomy position and the cervix brought into view with sterile speculum.  Portio of cervix cleansed x 2 with betadine swabs.  A tenaculum was placed in the anterior lip of the cervix.  The uterus was  sounded for depth of 8 cm. A pipelle was introduced to into the uterus, suction created,  and an endometrial sample was obtained. All equipment was removed and accounted for.  The patient tolerated the procedure well.    Patient given post procedure instructions. The patient will return in 2 weeks for results.   Assessment Postmenopausal bleeding, fibroid uterus.    Plan    RTC 2 weeks for result of EMB        ARNOLD,JAMES 09/01/2013, 3:04 PM

## 2013-09-16 ENCOUNTER — Telehealth: Payer: Self-pay | Admitting: Obstetrics and Gynecology

## 2013-09-16 ENCOUNTER — Ambulatory Visit: Payer: Self-pay | Admitting: Obstetrics & Gynecology

## 2013-09-16 NOTE — Telephone Encounter (Signed)
Patient canceled appt stating that her Medicaid ran out. She wanted to get the results over the phone instead. Gave endo Bx result which was benign. Okey and cleared by Dr. Debroah Loop. Patient advised to give [place] a call if bleeding worsens again. Patient states understanding.

## 2014-03-20 ENCOUNTER — Encounter (HOSPITAL_COMMUNITY): Payer: Self-pay | Admitting: Emergency Medicine

## 2014-03-20 DIAGNOSIS — L03119 Cellulitis of unspecified part of limb: Principal | ICD-10-CM

## 2014-03-20 DIAGNOSIS — L02419 Cutaneous abscess of limb, unspecified: Secondary | ICD-10-CM | POA: Insufficient documentation

## 2014-03-20 NOTE — ED Notes (Signed)
Pt st's she woke up Fri morning with right lower leg itching.  Now lower leg has blisters with swelling and pain to area

## 2014-03-21 ENCOUNTER — Emergency Department (HOSPITAL_COMMUNITY)
Admission: EM | Admit: 2014-03-21 | Discharge: 2014-03-21 | Disposition: A | Discharge status: Home / Self Care | Payer: Managed Care, Other (non HMO) | Attending: Emergency Medicine | Admitting: Emergency Medicine

## 2014-03-21 DIAGNOSIS — L03115 Cellulitis of right lower limb: Secondary | ICD-10-CM

## 2014-03-21 MED ORDER — CLINDAMYCIN HCL 150 MG PO CAPS: 300.0000 mg | ORAL_CAPSULE | Freq: Three times a day (TID) | ORAL | Status: DC

## 2014-03-21 MED ORDER — CLINDAMYCIN HCL 300 MG PO CAPS
300.0000 mg | ORAL_CAPSULE | Freq: Once | ORAL | Status: AC
  Administered 2014-03-21: 300 mg via ORAL
  Filled 2014-03-21: qty 1

## 2014-03-21 NOTE — ED Notes (Signed)
Noticed right lower leg itching with blister-like areas upon waking 3 days ago worsening daily.

## 2014-03-21 NOTE — Discharge Instructions (Signed)
Cellulitis Cellulitis is an infection of the skin and the tissue beneath it. The infected area is usually red and tender. Cellulitis occurs most often in the arms and lower legs.  CAUSES  Cellulitis is caused by bacteria that enter the skin through cracks or cuts in the skin. The most common types of bacteria that cause cellulitis are Staphylococcus and Streptococcus. SYMPTOMS   Redness and warmth.  Swelling.  Tenderness or pain.  Fever. DIAGNOSIS  Your caregiver can usually determine what is wrong based on a physical exam. Blood tests may also be done. TREATMENT  Treatment usually involves taking an antibiotic medicine. HOME CARE INSTRUCTIONS   Take your antibiotics as directed. Finish them even if you start to feel better.  Keep the infected arm or leg elevated to reduce swelling.  Apply a warm cloth to the affected area up to 4 times per day to relieve pain.  Only take over-the-counter or prescription medicines for pain, discomfort, or fever as directed by your caregiver.  Keep all follow-up appointments as directed by your caregiver. SEEK MEDICAL CARE IF:   You notice red streaks coming from the infected area.  Your red area gets larger or turns dark in color.  Your bone or joint underneath the infected area becomes painful after the skin has healed.  Your infection returns in the same area or another area.  You notice a swollen bump in the infected area.  You develop new symptoms. SEEK IMMEDIATE MEDICAL CARE IF:   You have a fever.  You feel very sleepy.  You develop vomiting or diarrhea.  You have a general ill feeling (malaise) with muscle aches and pains. MAKE SURE YOU:   Understand these instructions.  Will watch your condition.  Will get help right away if you are not doing well or get worse. Document Released: 09/11/2005 Document Revised: 06/02/2012 Document Reviewed: 02/17/2012 [employer] Patient Information 2014 [employer].  

## 2014-03-21 NOTE — ED Provider Notes (Signed)
CSN: [id number]     Arrival date & time 03/20/14  2307 History   First MD Initiated Contact with Patient 03/21/14 0224     Chief Complaint  Patient presents with  . Leg Pain     (Consider location/radiation/quality/duration/timing/severity/associated sxs/prior Treatment) HPI History per patient. Woke up yesterday morning with red rash right lower extremity, is painful with swelling. No associated fevers or chills. No nausea vomiting diarrhea.  No known trauma. There is a small central blister to her anterior shin. No history of same.   History reviewed. No pertinent past medical history. Past Surgical History  Procedure Laterality Date  . Hernia repair     History reviewed. No pertinent family history. History  Substance Use Topics  . Smoking status: Never Smoker   . Smokeless tobacco: Not on file  . Alcohol Use: No   OB History   Grav Para Term Preterm Abortions TAB SAB Ect Mult Living   7 6 6  1  1   6      Review of Systems  Constitutional: Negative for fever and chills.  Respiratory: Negative for shortness of breath.   Cardiovascular: Negative for chest pain.  Gastrointestinal: Negative for abdominal pain.  Genitourinary: Negative for dysuria.  Musculoskeletal: Negative for back pain, neck pain and neck stiffness.  Skin: Positive for rash.  Neurological: Negative for headaches.  All other systems reviewed and are negative.      Allergies  Review of patient's allergies indicates no known allergies.  Home Medications  No current outpatient prescriptions on file. BP 137/67  Pulse 94  Temp(Src) 97.8 F (36.6 C) (Oral)  Resp 16  Ht 5\' 1"  (1.549 m)  Wt 235 lb 2 oz (106.652 kg)  BMI 44.45 kg/m2  SpO2 100%  LMP 06/15/2013 Physical Exam  Constitutional: She is oriented to person, place, and time. She appears well-developed and well-nourished.  HENT:  Head: Normocephalic and atraumatic.  Eyes: EOM are normal. Pupils are equal, round, and reactive to light.   Neck: Neck supple.  Cardiovascular: Normal rate, regular rhythm and intact distal pulses.   Pulmonary/Chest: Effort normal and breath sounds normal. No respiratory distress.  Abdominal: Soft. Bowel sounds are normal. She exhibits no distension.  Musculoskeletal: Normal range of motion.  Right lower extremity with an area of erythema, tenderness and swelling to right anterior shin. There is small central blister without drainage. No underlying fluctuance. Neurovascular intact. No lymphangitic streaking.   Neurological: She is alert and oriented to person, place, and time.  Skin: Skin is warm and dry.    ED Course  Procedures (including critical care time) Labs Review Labs Reviewed - No data to display Imaging Review No results found.  Clindamycin provided.  Infection precautions provided. Plan discharge home with recheck 48 hours. Patient to return to the emergency department as she does not have a primary care physician. She states understanding of all discharge instructions. Prescription for clindamycin.  MDM   Diagnosis: Cellulitis right lower extremity  No fevers or vomiting or morbidities to suggest indication for admit at this time. Will trial outpatient treatment with close follow up. Vital signs and nursing notes reviewed and considered.    Teressa Lower, MD 03/21/14 [phone]

## 2014-03-27 ENCOUNTER — Emergency Department (HOSPITAL_COMMUNITY)
Admission: EM | Admit: 2014-03-27 | Discharge: 2014-03-27 | Disposition: A | Discharge status: Home / Self Care | Payer: Managed Care, Other (non HMO) | Attending: Emergency Medicine | Admitting: Emergency Medicine

## 2014-03-27 ENCOUNTER — Encounter (HOSPITAL_COMMUNITY): Payer: Self-pay | Admitting: Emergency Medicine

## 2014-03-27 DIAGNOSIS — T368X5A Adverse effect of other systemic antibiotics, initial encounter: Secondary | ICD-10-CM | POA: Insufficient documentation

## 2014-03-27 DIAGNOSIS — L539 Erythematous condition, unspecified: Secondary | ICD-10-CM | POA: Insufficient documentation

## 2014-03-27 DIAGNOSIS — L500 Allergic urticaria: Secondary | ICD-10-CM | POA: Insufficient documentation

## 2014-03-27 DIAGNOSIS — Z888 Allergy status to other drugs, medicaments and biological substances status: Secondary | ICD-10-CM | POA: Insufficient documentation

## 2014-03-27 DIAGNOSIS — L509 Urticaria, unspecified: Secondary | ICD-10-CM

## 2014-03-27 DIAGNOSIS — L270 Generalized skin eruption due to drugs and medicaments taken internally: Secondary | ICD-10-CM

## 2014-03-27 MED ORDER — DIPHENHYDRAMINE HCL 25 MG PO CAPS
25.0000 mg | ORAL_CAPSULE | Freq: Once | ORAL | Status: AC
  Administered 2014-03-27: 25 mg via ORAL
  Filled 2014-03-27: qty 1

## 2014-03-27 MED ORDER — HYDROXYZINE HCL 25 MG PO TABS: 25.0000 mg | ORAL_TABLET | Freq: Four times a day (QID) | ORAL | Status: DC

## 2014-03-27 MED ORDER — CEPHALEXIN 500 MG PO CAPS: 500.0000 mg | ORAL_CAPSULE | Freq: Four times a day (QID) | ORAL | Status: DC

## 2014-03-27 MED ORDER — TRIAMCINOLONE ACETONIDE 0.1 % EX CREA: 1.0000 "application " | TOPICAL_CREAM | Freq: Two times a day (BID) | CUTANEOUS | Status: DC

## 2014-03-27 NOTE — ED Provider Notes (Signed)
CSN: [id number]     Arrival date & time 03/27/14  1446 History  This chart was scribed for non-physician practitioner, Noland Fordyce, PA-C working with Ezequiel Essex, MD by Frederich Balding, ED scribe. This patient was seen in room TR05C/TR05C and the patient's care was started at 3:48 PM.   Chief Complaint  Patient presents with  . Rash   The history is provided by the patient. No language interpreter was used.   HPI Comments: Mackenzie Nichols is a 58 y.o. female who presents to the Emergency Department complaining of an itchy rash to her neck that started yesterday. She states the rash is starting to spread to her left shoulder. Pt states she started clindamycin for cellulitis on her right lower leg one week ago. The cellulitis has started to resolve. She has put hydrocortisone cream on the rash with little relief. Denies fever, nausea, emesis. Denies difficulty breathing or swallowing. Denies change in soaps or detergents. Denies recent travel or sick contacts.  History reviewed. No pertinent past medical history. Past Surgical History  Procedure Laterality Date  . Hernia repair     No family history on file. History  Substance Use Topics  . Smoking status: Never Smoker   . Smokeless tobacco: Not on file  . Alcohol Use: No   OB History   Grav Para Term Preterm Abortions TAB SAB Ect Mult Living   7 6 6  1  1   6      Review of Systems  Constitutional: Negative for fever.  Gastrointestinal: Negative for nausea and vomiting.  Skin: Positive for rash.  All other systems reviewed and are negative.  Allergies  Clindamycin/lincomycin  Home Medications   Current Outpatient Rx  Name  Route  Sig  Dispense  Refill  . Prenatal Vit-Fe Sulfate-FA (PRENATAL VITAMIN PO)   Oral   Take 1 tablet by mouth daily as needed (Dietary supplementation).         . cephALEXin (KEFLEX) 500 MG capsule   Oral   Take 1 capsule (500 mg total) by mouth 4 (four) times daily.   40 capsule   0   .  hydrOXYzine (ATARAX/VISTARIL) 25 MG tablet   Oral   Take 1 tablet (25 mg total) by mouth every 6 (six) hours.   12 tablet   0   . triamcinolone cream (KENALOG) 0.1 %   Topical   Apply 1 application topically 2 (two) times daily.   15 g   2    BP 128/63  Pulse 78  Temp(Src) 97.8 F (36.6 C) (Oral)  Resp 18  Ht 5\' 1"  (1.549 m)  Wt 237 lb 14.4 oz (107.911 kg)  BMI 44.97 kg/m2  SpO2 100%  LMP 06/15/2013  Physical Exam  Nursing note and vitals reviewed. Constitutional: She is oriented to person, place, and time. She appears well-developed and well-nourished.  HENT:  Head: Normocephalic and atraumatic.  Eyes: EOM are normal.  Neck: Normal range of motion.  Cardiovascular: Normal rate.   Pulmonary/Chest: Effort normal.  Musculoskeletal: Normal range of motion.  Neurological: She is alert and oriented to person, place, and time.  Skin: Skin is warm and dry. Rash noted. There is erythema.  Maculopapular erythematous rash on left side of neck, upper back, and left shoulder. No induration or fluctuance. No tenderness. Scant crusting discharge to rash in middle of upper back.    Right lower leg: no evidence of underlying infection.   Psychiatric: She has a normal mood and affect. Her  behavior is normal.    ED Course  Procedures (including critical care time)  DIAGNOSTIC STUDIES: Oxygen Saturation is 100% on RA, normal by my interpretation.    COORDINATION OF CARE: 3:51 PM-Discussed treatment plan which includes Benadryl with pt at bedside and pt agreed to plan.   Labs Review Labs Reviewed - No data to display Imaging Review No results found.   EKG Interpretation None      MDM   Final diagnoses:  Urticaria  Drug-induced skin rash    Pt presenting with pruritic rash to upper back, neck, and left shoulder after she was started on clindamycin 7 days ago.  Rash started yesterday. Denies difficulty breathing, nausea or vomiting. Pt does not have previous hx of drug  allergies. No other change in daily routine. No hx of contact with others with similar rash.  Will tx as allergic reaction. Benadryl given in ED. Will discharge home and advised to discontinue use of clindamycin as cellulitis of right leg appears to be resolved.  Rx: atarax, triamcinolone, and keflex.  Advised to f/u with PCP or return to ED as needed for further evaluation and treatment if symptoms not improving in 2-3 days. Pt verbalized understanding and agreement with tx plan.   I personally performed the services described in this documentation, which was scribed in my presence. The recorded information has been reviewed and is accurate.  Noland Fordyce, PA-C 03/27/14 1622

## 2014-03-27 NOTE — ED Notes (Signed)
The pt was seen here on the 5th and diagnosed with cellulitis of the rt lower leg.  She was given clindamycin and since yesterday she has had a red raised rash to the back of her neck with itching and the rash appears to be spreading

## 2014-03-27 NOTE — ED Notes (Signed)
Pt discharged to home with family. NAD.  

## 2014-03-27 NOTE — ED Notes (Signed)
Onset 03-21-14 dx: cellulitis to right lower leg.  Onset yesterday red, raised, itchy rash on back of neck. Pt put cortisone cream on rash with no relief in itching.  No respiratory or swallowing difficulties.  Pt started Clindamycin 03-21-14, took dose this am.

## 2014-03-28 NOTE — ED Provider Notes (Signed)
Medical screening examination/treatment/procedure(s) were performed by non-physician practitioner and as supervising physician I was immediately available for consultation/collaboration.   EKG Interpretation None        Ezequiel Essex, MD 03/28/14 [phone]

## 2014-07-03 ENCOUNTER — Emergency Department (HOSPITAL_COMMUNITY)
Admission: EM | Admit: 2014-07-03 | Discharge: 2014-07-03 | Disposition: A | Discharge status: Home / Self Care | Payer: Managed Care, Other (non HMO) | Attending: Emergency Medicine | Admitting: Emergency Medicine

## 2014-07-03 ENCOUNTER — Encounter (HOSPITAL_COMMUNITY): Payer: Self-pay | Admitting: Emergency Medicine

## 2014-07-03 ENCOUNTER — Emergency Department (HOSPITAL_COMMUNITY): Payer: Managed Care, Other (non HMO)

## 2014-07-03 DIAGNOSIS — Z79899 Other long term (current) drug therapy: Secondary | ICD-10-CM | POA: Insufficient documentation

## 2014-07-03 DIAGNOSIS — R0602 Shortness of breath: Secondary | ICD-10-CM | POA: Insufficient documentation

## 2014-07-03 DIAGNOSIS — R739 Hyperglycemia, unspecified: Secondary | ICD-10-CM

## 2014-07-03 DIAGNOSIS — I10 Essential (primary) hypertension: Secondary | ICD-10-CM

## 2014-07-03 DIAGNOSIS — R7309 Other abnormal glucose: Secondary | ICD-10-CM | POA: Insufficient documentation

## 2014-07-03 DIAGNOSIS — R42 Dizziness and giddiness: Secondary | ICD-10-CM | POA: Insufficient documentation

## 2014-07-03 LAB — I-STAT CHEM 8, ED
BUN: 16 mg/dL (ref 6–23)
CHLORIDE: 104 meq/L (ref 96–112)
Calcium, Ion: 1.18 mmol/L (ref 1.12–1.23)
Creatinine, Ser: 0.9 mg/dL (ref 0.50–1.10)
Glucose, Bld: 141 mg/dL — ABNORMAL HIGH (ref 70–99)
HEMATOCRIT: 40 % (ref 36.0–46.0)
Hemoglobin: 13.6 g/dL (ref 12.0–15.0)
Potassium: 3.8 mEq/L (ref 3.7–5.3)
SODIUM: 139 meq/L (ref 137–147)
TCO2: 23 mmol/L (ref 0–100)

## 2014-07-03 LAB — CBC
HEMATOCRIT: 35.8 % — AB (ref 36.0–46.0)
Hemoglobin: 12.1 g/dL (ref 12.0–15.0)
MCH: 29.1 pg (ref 26.0–34.0)
MCHC: 33.8 g/dL (ref 30.0–36.0)
MCV: 86.1 fL (ref 78.0–100.0)
PLATELETS: 301 10*3/uL (ref 150–400)
RBC: 4.16 MIL/uL (ref 3.87–5.11)
RDW: 13.7 % (ref 11.5–15.5)
WBC: 7 10*3/uL (ref 4.0–10.5)

## 2014-07-03 LAB — BASIC METABOLIC PANEL
ANION GAP: 14 (ref 5–15)
BUN: 15 mg/dL (ref 6–23)
CO2: 24 mEq/L (ref 19–32)
Calcium: 8.5 mg/dL (ref 8.4–10.5)
Chloride: 102 mEq/L (ref 96–112)
Creatinine, Ser: 0.75 mg/dL (ref 0.50–1.10)
GFR calc non Af Amer: >90 mL/min (ref >90)
Glucose, Bld: 136 mg/dL — ABNORMAL HIGH (ref 70–99)
Potassium: 4.1 mEq/L (ref 3.7–5.3)
Sodium: 140 mEq/L (ref 137–147)

## 2014-07-03 MED ORDER — HYDRALAZINE HCL 20 MG/ML IJ SOLN
20.0000 mg | Freq: Once | INTRAMUSCULAR | Status: AC
  Administered 2014-07-03: 10 mg via INTRAVENOUS
  Filled 2014-07-03: qty 1

## 2014-07-03 MED ORDER — AMLODIPINE BESYLATE 5 MG PO TABS: 5.0000 mg | ORAL_TABLET | Freq: Every day | ORAL | Status: DC

## 2014-07-03 NOTE — ED Provider Notes (Signed)
CSN: [id number]     Arrival date & time 07/03/14  1041 History   First MD Initiated Contact with Patient 07/03/14 1047     Chief Complaint  Patient presents with  . Dizziness  . Shortness of Breath     (Consider location/radiation/quality/duration/timing/severity/associated sxs/prior Treatment) HPI Comments: Mackenzie Nichols is a 58 y.o. Female with no known PMHx reports that she awoke at 9am this morning and had a "funny feeling" in her head, describes it as being presyncopal/lightheaded but not vertiginous and not having syncopal episode. This feeling persisted when she got up and moved about her house. Then she states it made her feel somewhat short of breath and with a fluttery feeling in her chest but not painful. This episode lasted approximately 30 minutes which is when her daughter called the ambulance. Pt states that when she arrived here today she is not feeling her symptoms any more. Denies that her symptoms occurred or worsened when she went from sitting to standing, but rather that she awoke with this feeling in her head, and exertion and rest didn't change the symptoms. Denies that head movement changed the symptoms. Denies epistaxis, HA, vision changes, CP, SOB, weakness, paresthesias, LE edema, abd pain, N/V/D/C, palpitations, urinary symptoms, vaginal symptoms, myalgias or arthralgias. Denies hx of HTN or thyroid issues. Endorses +FHx of HTN. States she does not drink much water.  Patient is a 58 y.o. female presenting with dizziness and shortness of breath. The history is provided by the patient. No language interpreter was used.  Dizziness Quality:  Lightheadedness Severity:  Mild Onset quality:  Sudden Duration:  2 hours Timing:  Intermittent Progression:  Partially resolved Chronicity:  New Context: not when bending over, not with ear pain, not with eye movement, not with head movement, not with loss of consciousness, not with medication, not with physical activity, not when  standing up and not when urinating   Relieved by:  None tried Worsened by:  Standing up Ineffective treatments:  None tried Associated symptoms: shortness of breath (lasting less than 30 minutes and now resolved)   Associated symptoms: no blood in stool, no chest pain, no diarrhea, no headaches, no hearing loss, no nausea, no palpitations ("fluttery" in her chest but denies palpitations), no syncope, no tinnitus, no vision changes, no vomiting and no weakness   Shortness of Breath Associated symptoms: no abdominal pain, no chest pain, no diaphoresis, no ear pain, no fever, no headaches, no neck pain, no syncope, no vomiting and no wheezing     History reviewed. No pertinent past medical history. Past Surgical History  Procedure Laterality Date  . Hernia repair     No family history on file. History  Substance Use Topics  . Smoking status: Never Smoker   . Smokeless tobacco: Not on file  . Alcohol Use: No   OB History   Grav Para Term Preterm Abortions TAB SAB Ect Mult Living   7 6 6  1  1   6      Review of Systems  Constitutional: Negative for fever, chills and diaphoresis.  HENT: Negative for ear pain, hearing loss, nosebleeds, sinus pressure and tinnitus.   Respiratory: Positive for shortness of breath (lasting less than 30 minutes and now resolved). Negative for wheezing.   Cardiovascular: Negative for chest pain, palpitations ("fluttery" in her chest but denies palpitations) and syncope.  Gastrointestinal: Negative for nausea, vomiting, abdominal pain, diarrhea, constipation, blood in stool and abdominal distention.  Genitourinary: Negative for dysuria, urgency, hematuria  and difficulty urinating.  Musculoskeletal: Negative for back pain, myalgias, neck pain and neck stiffness.  Skin: Negative for color change.  Neurological: Positive for dizziness. Negative for tremors, syncope, facial asymmetry, speech difficulty, weakness, light-headedness, numbness and headaches.   Psychiatric/Behavioral: Negative for confusion.  10 Systems reviewed and are negative for acute change except as noted in the HPI.     Allergies  Clindamycin/lincomycin  Home Medications   Prior to Admission medications   Medication Sig Start Date End Date Taking? Authorizing Provider  Multiple Vitamin (MULTIVITAMIN WITH MINERALS) TABS tablet Take 1 tablet by mouth daily.   Yes Historical Provider, MD  amLODipine (NORVASC) 5 MG tablet Take 1 tablet (5 mg total) by mouth daily. 07/03/14   Rakeem Colley Strupp Camprubi-Soms, PA-C   BP 133/84  Pulse 85  Temp(Src) 98.1 F (36.7 C) (Oral)  Resp 21  Ht 5\' 2"  (1.575 m)  Wt 238 lb (107.956 kg)  BMI 43.52 kg/m2  SpO2 100%  LMP 06/15/2013 Physical Exam  Nursing note and vitals reviewed. Constitutional: She is oriented to person, place, and time. She appears well-developed and well-nourished. No distress.  Mildly hypertensive  HENT:  Head: Normocephalic and atraumatic.  Right Ear: Hearing, tympanic membrane, external ear and ear canal normal.  Left Ear: Hearing, tympanic membrane, external ear and ear canal normal.  Nose: Nose normal.  Mouth/Throat: Uvula is midline, oropharynx is clear and moist and mucous membranes are normal.  MMM, uvula midline, tongue protrusion non-deviated  Eyes: Conjunctivae and EOM are normal. Pupils are equal, round, and reactive to light. Right eye exhibits no discharge. Left eye exhibits no discharge.  PERRL, EOMI without nystagmus  Neck: Normal range of motion. Neck supple. No JVD present. No spinous process tenderness and no muscular tenderness present. Carotid bruit is not present. No rigidity. Normal range of motion present. No thyromegaly present.  No carotid bruit or JVD. No thyromegaly. Cspine with FROM intact, non-TTP, no meningeal signs  Cardiovascular: Normal rate, regular rhythm, normal heart sounds and intact distal pulses.  Exam reveals no gallop and no friction rub.   No murmur heard. RRR, nl  s1/s2, no m/r/g, intact distal pulses in all extremities. Sinus rhythm on the monitor with no abnormalities noted  Pulmonary/Chest: Effort normal and breath sounds normal. No respiratory distress. She has no decreased breath sounds. She has no wheezes. She has no rales.  CTAB no w/r/r  Abdominal: Soft. Normal appearance and bowel sounds are normal. She exhibits no distension, no abdominal bruit and no pulsatile midline mass. There is no tenderness. There is no rigidity, no rebound and no guarding.  Musculoskeletal: Normal range of motion.  Strength 5/5 in all extremities, sensation grossly intact, FROM intact, gait WNL, cap refill <3 seconds  Neurological: She is alert and oriented to person, place, and time. She has normal strength and normal reflexes. No cranial nerve deficit or sensory deficit. She displays a negative Romberg sign. Coordination and gait normal. GCS eye subscore is 4. GCS verbal subscore is 5. GCS motor subscore is 6.  A&Ox4, GCS 15, sensation grossly intact in all extremities, strength 5/5 in all extremities, CNII-XII grossly intact, negative cerebellar testing, gait non-ataxic and able to perform tandem walking without issue, negative pronator drift, neg romberg. DTRs equal bilaterally  Skin: Skin is warm, dry and intact. No rash noted.  Psychiatric: She has a normal mood and affect.    ED Course  Procedures (including critical care time) Labs Review Labs Reviewed  CBC - Abnormal; Notable for  the following:    HCT 35.8 (*)    All other components within normal limits  BASIC METABOLIC PANEL - Abnormal; Notable for the following:    Glucose, Bld 136 (*)    All other components within normal limits  I-STAT CHEM 8, ED - Abnormal; Notable for the following:    Glucose, Bld 141 (*)    All other components within normal limits    Imaging Review Dg Chest 2 View  07/03/2014   CLINICAL DATA:  Dizziness and abdominal pain.  EXAM: CHEST  2 VIEW  COMPARISON:  01/05/2011   FINDINGS: The heart size and mediastinal contours are within normal limits. Both lungs are clear. The visualized skeletal structures are unremarkable.  IMPRESSION: No active cardiopulmonary disease.   Electronically Signed   By: Lajean Manes M.D.   On: 07/03/2014 12:44     EKG Interpretation None      MDM   Final diagnoses:  Dizziness  Essential hypertension  Hyperglycemia    Mackenzie Nichols is a 58 y.o. female with no reported PMHx although upon chart review it appears she was told that she had borderline HTN and preDM one year ago, who presents today with a "funny feeling" described as somewhat presyncopal and a fleeting sensation of an odd dyspnea and chest flutter which went away and pt denies palpitations or syncope. Heart exam benign and pt in sinus rhythm on the monitor with no arrhythmias noted on waveform, neuro exam benign. Pt denies CP, and denies ongoing symptoms at this time. Will obtain CXR and orthostatics, as well as basic labs.   11:45 AM Orthostatics noted hypertension worsened with standing, and pt states that her lightheadedness returns when she stood for the test. Denies that it's a headache, but said it feels somewhat funny. Continues to deny CP or ongoing SOB, as well as any palpitations. EKG noted as completed in orders but not available for review, may have been given to Dr. Alvino Chapel, see his note for results. No abnormalities reported to me, and monitor with no obvious abnormalities. No neuro deficits with standing. Will give hydralazine to see if with improved BP pt's symptoms will resolve when standing. Will not image head at this time  1:20 PM Pt still says she feels "just not right" but doesn't feel SOB or with CP as she had this morning, and when ambulated, states her dizziness has subsided. BP taken and found to be 150/90 approx 1 hr after hydralazine. Will send home with Norvasc and f/up with PCP. Fasting CBG also noted to be 136, will need to discuss with PCP  regarding eval for preDM. I explained the diagnosis and have given explicit precautions to return to the ER including for any other new or worsening symptoms. The patient understands and accepts the medical plan as it's been dictated and I have answered their questions. Discharge instructions concerning home care and prescriptions have been given. The patient is STABLE and is discharged to home in good condition.  BP 133/84  Pulse 85  Temp(Src) 98.1 F (36.7 C) (Oral)  Resp 21  Ht 5\' 2"  (1.575 m)  Wt 238 lb (107.956 kg)  BMI 43.52 kg/m2  SpO2 100%  LMP 06/15/2013   Patty Sermons Camprubi-Soms, PA-C 07/03/14 2106

## 2014-07-03 NOTE — ED Notes (Signed)
Per EMS: pt from home for eval of dizziness that started today, pt also reports sob with exertion and reports one episode of heart palpitation felt on left side of chest. Pt states the sob came with the palpitations and went away when the palpitations subsided. No neuro deficits noted by EMS, pt denies any HA, recent falls or LOC, denies n/v/d or fevers. Pt denies any pain or sob at this time. nad noted, axo x4.

## 2014-07-03 NOTE — Discharge Instructions (Signed)
Your blood pressure was noted to be high, and this could be the cause of your symptoms. You have been given a medication called Norvasc to take every day. See your primary care doctor to have continued care of your blood pressure, as well as to evaluate your blood sugar. Eat a low salt diet, and avoid highly sugary foods. Stay well hydrated. If any symptoms change or worsen, return to the emergency department.    Dizziness  Dizziness means you feel unsteady or lightheaded. You might feel like you are going to pass out (faint). HOME CARE   Drink enough fluids to keep your pee (urine) clear or pale yellow.  Take your medicines exactly as told by your doctor. If you take blood pressure medicine, always stand up slowly from the lying or sitting position. Hold on to something to steady yourself.  If you need to stand in one place for a long time, move your legs often. Tighten and relax your leg muscles.  Have someone stay with you until you feel okay.  Do not drive or use heavy machinery if you feel dizzy.  Do not drink alcohol. GET HELP RIGHT AWAY IF:   You feel dizzy or lightheaded and it gets worse.  You feel sick to your stomach (nauseous), or you throw up (vomit).  You have trouble talking or walking.  You feel weak or have trouble using your arms, hands, or legs.  You cannot think clearly or have trouble forming sentences.  You have chest pain, belly (abdominal) pain, sweating, or you are short of breath.  Your vision changes.  You are bleeding.  You have problems from your medicine that seem to be getting worse. MAKE SURE YOU:   Understand these instructions.  Will watch your condition.  Will get help right away if you are not doing well or get worse. Document Released: 11/21/2011 Document Revised: 02/24/2012 Document Reviewed: 11/21/2011 [facility] Patient Information 2015 [place], [place]. This information is not intended to replace advice given to you by your health  care provider. Make sure you discuss any questions you have with your health care provider.  High Blood Sugar High blood sugar (hyperglycemia) means that the level of sugar in your blood is higher than it should be. Signs of high blood sugar include:  Feeling thirsty.  Frequent peeing (urinating).  Feeling tired or sleepy.  Dry mouth.  Vision changes.  Feeling weak.  Feeling hungry but losing weight.  Numbness and tingling in your hands or feet.  Headache. When you ignore these signs, your blood sugar may keep going up. These problems may get worse, and other problems may begin. HOME CARE  Check your blood sugars as told by your doctor. Write down the numbers with the date and time.  Take the right amount of insulin or diabetes pills at the right time. Write down the dose with date and time.  Refill your insulin or diabetes pills before running out.  Watch what you eat. Follow your meal plan.  Drink liquids without sugar, such as water. Check with your doctor if you have kidney or heart disease.  Follow your doctor's orders for exercise. Exercise at the same time of day.  Keep your doctor's appointments. GET HELP RIGHT AWAY IF:   You have trouble thinking or are confused.  You have fast breathing with fruity smelling breath.  You pass out (faint).  You have 2 to 3 days of high blood sugars and you do not know why.  You  have chest pain.  You are feeling sick to your stomach (nauseous) or throwing up (vomiting).  You have sudden vision changes. MAKE SURE YOU:   Understand these instructions.  Will watch your condition.  Will get help right away if you are not doing well or get worse. Document Released: 09/29/2009 Document Revised: 02/24/2012 Document Reviewed: 09/29/2009 [facility] Patient Information 2015 [place], [place]. This information is not intended to replace advice given to you by your health care provider. Make sure you discuss any questions you have  with your health care provider.  Hypertension Hypertension is another name for high blood pressure. High blood pressure forces your heart to work harder to pump blood. A blood pressure reading has two numbers, which includes a higher number over a lower number (example: 110/72). HOME CARE   Have your blood pressure rechecked by your doctor.  Only take medicine as told by your doctor. Follow the directions carefully. The medicine does not work as well if you skip doses. Skipping doses also puts you at risk for problems.  Do not smoke.  Monitor your blood pressure at home as told by your doctor. GET HELP IF:  You think you are having a reaction to the medicine you are taking.  You have repeat headaches or feel dizzy.  You have puffiness (swelling) in your ankles.  You have trouble with your vision. GET HELP RIGHT AWAY IF:   You get a very bad headache and are confused.  You feel weak, numb, or faint.  You get chest or belly (abdominal) pain.  You throw up (vomit).  You cannot breathe very well. MAKE SURE YOU:   Understand these instructions.  Will watch your condition.  Will get help right away if you are not doing well or get worse. Document Released: 05/20/2008 Document Revised: 12/07/2013 Document Reviewed: 09/24/2013 [facility] Patient Information 2015 [place], [place]. This information is not intended to replace advice given to you by your health care provider. Make sure you discuss any questions you have with your health care provider.  DASH Eating Plan DASH stands for "Dietary Approaches to Stop Hypertension." The DASH eating plan is a healthy eating plan that has been shown to reduce high blood pressure (hypertension). Additional health benefits may include reducing the risk of type 2 diabetes mellitus, heart disease, and stroke. The DASH eating plan may also help with weight loss. WHAT DO I NEED TO KNOW ABOUT THE DASH EATING PLAN? For the DASH eating plan, you will  follow these general guidelines:  Choose foods with a percent daily value for sodium of less than 5% (as listed on the food label).  Use salt-free seasonings or herbs instead of table salt or sea salt.  Check with your health care provider or pharmacist before using salt substitutes.  Eat lower-sodium products, often labeled as "lower sodium" or "no salt added."  Eat fresh foods.  Eat more vegetables, fruits, and low-fat dairy products.  Choose whole grains. Look for the word "whole" as the first word in the ingredient list.  Choose fish and skinless chicken or [place] more often than red meat. Limit fish, poultry, and meat to 6 oz (170 g) each day.  Limit sweets, desserts, sugars, and sugary drinks.  Choose heart-healthy fats.  Limit cheese to 1 oz (28 g) per day.  Eat more home-cooked food and less restaurant, buffet, and fast food.  Limit fried foods.  Cook foods using methods other than frying.  Limit canned vegetables. If you do use them,  rinse them well to decrease the sodium.  When eating at a restaurant, ask that your food be prepared with less salt, or no salt if possible. WHAT FOODS CAN I EAT? Seek help from a dietitian for individual calorie needs. Grains Whole grain or whole wheat bread. Brown rice. Whole grain or whole wheat pasta. Quinoa, bulgur, and whole grain cereals. Low-sodium cereals. Corn or whole wheat flour tortillas. Whole grain cornbread. Whole grain crackers. Low-sodium crackers. Vegetables Fresh or frozen vegetables (raw, steamed, roasted, or grilled). Low-sodium or reduced-sodium tomato and vegetable juices. Low-sodium or reduced-sodium tomato sauce and paste. Low-sodium or reduced-sodium canned vegetables.  Fruits All fresh, canned (in natural juice), or frozen fruits. Meat and Other Protein Products Ground beef (85% or leaner), grass-fed beef, or beef trimmed of fat. Skinless chicken or [place]. Ground chicken or [place]. Pork trimmed of fat. All  fish and seafood. Eggs. Dried beans, peas, or lentils. Unsalted nuts and seeds. Unsalted canned beans. Dairy Low-fat dairy products, such as skim or 1% milk, 2% or reduced-fat cheeses, low-fat ricotta or cottage cheese, or plain low-fat yogurt. Low-sodium or reduced-sodium cheeses. Fats and Oils Tub margarines without trans fats. Light or reduced-fat mayonnaise and salad dressings (reduced sodium). Avocado. Safflower, olive, or canola oils. Natural peanut or almond butter. Other Unsalted popcorn and pretzels. The items listed above may not be a complete list of recommended foods or beverages. Contact your dietitian for more options. WHAT FOODS ARE NOT RECOMMENDED? Grains White bread. White pasta. White rice. Refined cornbread. Bagels and croissants. Crackers that contain trans fat. Vegetables Creamed or fried vegetables. Vegetables in a cheese sauce. Regular canned vegetables. Regular canned tomato sauce and paste. Regular tomato and vegetable juices. Fruits Dried fruits. Canned fruit in light or heavy syrup. Fruit juice. Meat and Other Protein Products Fatty cuts of meat. Ribs, chicken wings, bacon, sausage, bologna, salami, chitterlings, fatback, hot dogs, bratwurst, and packaged luncheon meats. Salted nuts and seeds. Canned beans with salt. Dairy Whole or 2% milk, cream, half-and-half, and cream cheese. Whole-fat or sweetened yogurt. Full-fat cheeses or blue cheese. Nondairy creamers and whipped toppings. Processed cheese, cheese spreads, or cheese curds. Condiments Onion and garlic salt, seasoned salt, table salt, and sea salt. Canned and packaged gravies. Worcestershire sauce. Tartar sauce. Barbecue sauce. Teriyaki sauce. Soy sauce, including reduced sodium. Steak sauce. Fish sauce. Oyster sauce. Cocktail sauce. Horseradish. Ketchup and mustard. Meat flavorings and tenderizers. Bouillon cubes. Hot sauce. Tabasco sauce. Marinades. Taco seasonings. Relishes. Fats and Oils Butter, stick  margarine, lard, shortening, ghee, and bacon fat. Coconut, palm kernel, or palm oils. Regular salad dressings. Other Pickles and olives. Salted popcorn and pretzels. The items listed above may not be a complete list of foods and beverages to avoid. Contact your dietitian for more information. WHERE CAN I FIND MORE INFORMATION? National Heart, [employer], and [employer] Institute: travelstabloid.com Document Released: 11/21/2011 Document Revised: 12/07/2013 Document Reviewed: 10/06/2013 [facility] Patient Information 2015 [place], [place]. This information is not intended to replace advice given to you by your health care provider. Make sure you discuss any questions you have with your health care provider.

## 2014-07-04 NOTE — ED Provider Notes (Signed)
Medical screening examination/treatment/procedure(s) were performed by non-physician practitioner and as supervising physician I was immediately available for consultation/collaboration.   EKG Interpretation None       Mackenzie Nichols. Alvino Chapel, MD 07/04/14 [phone]

## 2014-07-15 ENCOUNTER — Encounter: Payer: Self-pay | Admitting: Family Medicine

## 2014-07-15 ENCOUNTER — Ambulatory Visit (INDEPENDENT_AMBULATORY_CARE_PROVIDER_SITE_OTHER): Payer: Managed Care, Other (non HMO) | Admitting: Family Medicine

## 2014-07-15 VITALS — BP 135/66 | HR 73 | Temp 98.1°F | Ht 62.0 in | Wt 233.0 lb

## 2014-07-15 DIAGNOSIS — I10 Essential (primary) hypertension: Secondary | ICD-10-CM

## 2014-07-15 DIAGNOSIS — E119 Type 2 diabetes mellitus without complications: Secondary | ICD-10-CM | POA: Insufficient documentation

## 2014-07-15 DIAGNOSIS — R5383 Other fatigue: Secondary | ICD-10-CM | POA: Insufficient documentation

## 2014-07-15 DIAGNOSIS — Z6841 Body Mass Index (BMI) 40.0 and over, adult: Secondary | ICD-10-CM

## 2014-07-15 DIAGNOSIS — R5382 Chronic fatigue, unspecified: Secondary | ICD-10-CM

## 2014-07-15 DIAGNOSIS — R5381 Other malaise: Secondary | ICD-10-CM

## 2014-07-15 LAB — POCT GLYCOSYLATED HEMOGLOBIN (HGB A1C): HEMOGLOBIN A1C: 7

## 2014-07-15 NOTE — Patient Instructions (Signed)
Ms. Scism, please see [place] back in about 6-8 weeks.  Please start walking 15-20 minutes per day and increase the number of days per week you do this.  As well, try some of these diet things below.  Thanks, Dr. Awanda Mink  Make three lists of vegetables: (1) those you like and eat now; (2) vegetables you won't even consider; and (3) vegetables you might consider trying if they are prepared a certain way.  Continue to eat veg's you currently eat, but from this last list, choose a vegetable to try at least 3 times a week.  Use small amounts of this vegetable, cut small, combined with foods or seasonings you like.    Try to eat your fruit, not drink it.  Fruit Juice and Fruit drinks are very high in added sugar and preservatives.  Naturally occuring fruits have a good amount of fiber and sugar.  This mix allows for a more steady release of sugar from your stomach to be used by your body.  This will prevent you from "crashing" and feeling tired after a sugary meal.    MOST IMPORTANTLY, THIS IS A JOURNEY!  It is ok to have a day here or there where you don't follow a diet to the exact guidelines.  THIS IS OK!  The goal of this journey is to learn to balance the amount of foods you are putting into your body, starting to exercise (WHICH WILL RELEASE NATURAL "FEEL GOOD HORMONES" TO MAKE YOU HAPPIER).  A day here or there, or a meal here or there that isn't within your diet guidelines is all right.  Accept it, move on, and look forward to the next day.

## 2014-07-15 NOTE — Progress Notes (Signed)
Mackenzie Nichols is a 58 y.o. female who presents today for f/u from ED for dizziness.  Pt states that this has been ongoing now for several months.   In the ED she had multiple evaluations completed including CXR nml, CBC nml, BMET nml, EKG showing LAFB and poor RW progression, orthostatic vitals which were negative.  Specifically she notices it more when she eats something sweet and will "not feel right", but denies specifically dizziness/blurred vision, tinnitus, HA.  She also denies any polyuria, dysuria, polydipsia, CP, SOB, leg edema.    HTN - She was started on Norvasc in the ED for elevated BP.  She has been tolerating the medication well w/o any HA, edema, or ortho stasis.  BP today well controlled at 135/66.  Obesity - Pt interested in not taking medications for her BP, wondering how she can lose weight and what she needs to do to exercise.  States "laziness" is the biggest thing preventing her from exercising.     No past medical history on file.  History  Smoking status  . Never Smoker   Smokeless tobacco  . Not on file    No family history on file.  Current Outpatient Prescriptions on File Prior to Visit  Medication Sig Dispense Refill  . amLODipine (NORVASC) 5 MG tablet Take 1 tablet (5 mg total) by mouth daily.  30 tablet  1  . Multiple Vitamin (MULTIVITAMIN WITH MINERALS) TABS tablet Take 1 tablet by mouth daily.       No current facility-administered medications on file prior to visit.    ROS: Per HPI.  All other systems reviewed and are negative.   Physical Exam Filed Vitals:   07/15/14 0855  BP: 135/66  Pulse: 73  Temp: 98.1 F (36.7 C)    Physical Examination: General appearance - alert, well appearing, and in no distress Mouth - mucous membranes moist, pharynx normal without lesions Chest - clear to auscultation, no wheezes, rales or rhonchi, symmetric air entry Heart - normal rate and regular rhythm, S1 and S2 normal, no murmurs noted    Chemistry       Component Value Date/Time   NA 139 07/03/2014 1137   K 3.8 07/03/2014 1137   CL 104 07/03/2014 1137   CO2 24 07/03/2014 1118   BUN 16 07/03/2014 1137   CREATININE 0.90 07/03/2014 1137   CREATININE 0.80 09/16/2012 1111      Component Value Date/Time   CALCIUM 8.5 07/03/2014 1118   ALKPHOS 41 09/16/2012 1111   AST 17 09/16/2012 1111   ALT 13 09/16/2012 1111   BILITOT 0.5 09/16/2012 1111      Lab Results  Component Value Date   WBC 7.0 07/03/2014   HGB 13.6 07/03/2014   HCT 40.0 07/03/2014   MCV 86.1 07/03/2014   PLT 301 07/03/2014   Lab Results  Component Value Date   TSH 0.813 09/16/2012   Lab Results  Component Value Date   HGBA1C 6.3 06/25/2013

## 2014-07-15 NOTE — Assessment & Plan Note (Signed)
Continue Norvasc, f/u in 6 weeks

## 2014-07-15 NOTE — Assessment & Plan Note (Signed)
Diet and exercise discussed at length today.  Recommend some nutrition changes and starting to walk.  If no improvement or lack of motivation in one to two months, will consider sending to personal trainer and nutrition.

## 2014-07-15 NOTE — Assessment & Plan Note (Signed)
Pt Sx concerning for hyperglycemia.  Will obtain A1C, CMET, Lipid Panel, TSH, CBC, Vitamin D, HIV today.  As well, can consider B12, Folate, RPR in future if no resolution or initial testing is equivocal or unrevealing.

## 2014-07-16 LAB — COMPREHENSIVE METABOLIC PANEL
ALBUMIN: 3.7 g/dL (ref 3.5–5.2)
ALT: 14 U/L (ref 0–35)
AST: 16 U/L (ref 0–37)
Alkaline Phosphatase: 40 U/L (ref 39–117)
BUN: 12 mg/dL (ref 6–23)
CALCIUM: 9.2 mg/dL (ref 8.4–10.5)
CHLORIDE: 101 meq/L (ref 96–112)
CO2: 26 meq/L (ref 19–32)
Creat: 0.81 mg/dL (ref 0.50–1.10)
Glucose, Bld: 136 mg/dL — ABNORMAL HIGH (ref 70–99)
POTASSIUM: 3.6 meq/L (ref 3.5–5.3)
Sodium: 140 mEq/L (ref 135–145)
Total Bilirubin: 0.5 mg/dL (ref 0.2–1.2)
Total Protein: 7 g/dL (ref 6.0–8.3)

## 2014-07-16 LAB — LIPID PANEL
CHOLESTEROL: 205 mg/dL — AB (ref 0–200)
HDL: 49 mg/dL (ref >39)
LDL Cholesterol: 136 mg/dL — ABNORMAL HIGH (ref 0–99)
TRIGLYCERIDES: 99 mg/dL (ref <150)
Total CHOL/HDL Ratio: 4.2 Ratio
VLDL: 20 mg/dL (ref 0–40)

## 2014-07-16 LAB — VITAMIN D 25 HYDROXY (VIT D DEFICIENCY, FRACTURES): VIT D 25 HYDROXY: 37 ng/mL (ref 30–89)

## 2014-07-16 LAB — TSH: TSH: 0.757 u[IU]/mL (ref 0.350–4.500)

## 2014-07-16 LAB — HIV ANTIBODY (ROUTINE TESTING W REFLEX): HIV 1&2 Ab, 4th Generation: NONREACTIVE

## 2014-07-29 ENCOUNTER — Encounter (HOSPITAL_COMMUNITY): Payer: Self-pay | Admitting: Emergency Medicine

## 2014-07-29 ENCOUNTER — Emergency Department (HOSPITAL_COMMUNITY)
Admission: EM | Admit: 2014-07-29 | Discharge: 2014-07-29 | Disposition: A | Discharge status: Home / Self Care | Payer: Managed Care, Other (non HMO) | Attending: Emergency Medicine | Admitting: Emergency Medicine

## 2014-07-29 ENCOUNTER — Emergency Department (HOSPITAL_COMMUNITY): Payer: Managed Care, Other (non HMO)

## 2014-07-29 DIAGNOSIS — X500XXA Overexertion from strenuous movement or load, initial encounter: Secondary | ICD-10-CM | POA: Diagnosis not present

## 2014-07-29 DIAGNOSIS — S46909A Unspecified injury of unspecified muscle, fascia and tendon at shoulder and upper arm level, unspecified arm, initial encounter: Secondary | ICD-10-CM | POA: Insufficient documentation

## 2014-07-29 DIAGNOSIS — I10 Essential (primary) hypertension: Secondary | ICD-10-CM | POA: Diagnosis not present

## 2014-07-29 DIAGNOSIS — Y9210 Unspecified residential institution as the place of occurrence of the external cause: Secondary | ICD-10-CM | POA: Insufficient documentation

## 2014-07-29 DIAGNOSIS — Z79899 Other long term (current) drug therapy: Secondary | ICD-10-CM | POA: Diagnosis not present

## 2014-07-29 DIAGNOSIS — S4980XA Other specified injuries of shoulder and upper arm, unspecified arm, initial encounter: Secondary | ICD-10-CM | POA: Diagnosis not present

## 2014-07-29 DIAGNOSIS — S4991XA Unspecified injury of right shoulder and upper arm, initial encounter: Secondary | ICD-10-CM

## 2014-07-29 DIAGNOSIS — Y9389 Activity, other specified: Secondary | ICD-10-CM | POA: Diagnosis not present

## 2014-07-29 HISTORY — DX: Essential (primary) hypertension: I10

## 2014-07-29 MED ORDER — TRAMADOL HCL 50 MG PO TABS: 50.0000 mg | ORAL_TABLET | Freq: Four times a day (QID) | ORAL | Status: DC | PRN

## 2014-07-29 NOTE — Discharge Instructions (Signed)
Take pain medication as needed for pain.  Do not drive or operate heavy machinery for 4-6 hours after taking medication.

## 2014-07-29 NOTE — ED Notes (Signed)
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  Pt c/o pain to this area.

## 2014-07-29 NOTE — ED Notes (Signed)
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  She was transfering a pt taND HEARD SOMETHING POP IN HER RT SHOULDER .  PAIN [place]

## 2014-07-29 NOTE — ED Provider Notes (Signed)
CSN: [id number]     Arrival date & time 07/29/14  1714 History  This chart was scribed for non-physician practitioner, Hyman Bible, PA-C working with Orlie Dakin, MD by Frederich Balding, ED scribe. This patient was seen in room TR10C/TR10C and the patient's care was started at 6:56 PM.    Chief Complaint  Patient presents with  . Shoulder Injury   The history is provided by the patient. No language interpreter was used.   HPI Comments: Mackenzie Nichols is a 58 y.o. female who presents to the Emergency Department complaining of right shoulder injury that occurred prior to arrival. 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 Reports constant pain since. Movement worsens the pain. Denies prior injury to her shoulder. Pt has not taken any medications for her symptoms. Denies shoulder swelling, numbness or tingling in her hand.   Past Medical History  Diagnosis Date  . Hypertension    Past Surgical History  Procedure Laterality Date  . Hernia repair     No family history on file. History  Substance Use Topics  . Smoking status: Never Smoker   . Smokeless tobacco: Not on file  . Alcohol Use: No   OB History   Grav Para Term Preterm Abortions TAB SAB Ect Mult Living   7 6 6  1  1   6      Review of Systems  Musculoskeletal: Positive for arthralgias. Negative for joint swelling.  Neurological: Negative for numbness.  All other systems reviewed and are negative.  Allergies  Clindamycin/lincomycin  Home Medications   Prior to Admission medications   Medication Sig Start Date End Date Taking? Authorizing Provider  amLODipine (NORVASC) 5 MG tablet Take 5 mg by mouth daily. 07/03/14  Yes Mercedes Strupp Camprubi-Soms, PA-C  Multiple Vitamin (MULTIVITAMIN WITH MINERALS) TABS tablet Take 1 tablet by mouth daily.   Yes Historical Provider, MD   BP 143/79  Pulse 108  Temp(Src) 97.8 F (36.6 C)  Resp 18  Ht 5'  1" (1.549 m)  Wt 226 lb (102.513 kg)  BMI 42.72 kg/m2  LMP 06/15/2013  Physical Exam  Nursing note and vitals reviewed. Constitutional: She appears well-developed and well-nourished.  HENT:  Head: Normocephalic and atraumatic.  Mouth/Throat: Oropharynx is clear and moist.  Eyes: EOM are normal. Pupils are equal, round, and reactive to light.  Neck: Normal range of motion. Neck supple.  Cardiovascular: Normal rate, regular rhythm and normal heart sounds.   2+ radial pulse.  Pulmonary/Chest: Effort normal and breath sounds normal. She has no wheezes.  Musculoskeletal: Normal range of motion.  Distal sensation of all fingers of right hand intact. No tenderness to palpation of right shoulder. Pain with flexion and extension of the shoulder. Pain with abduction. Full ROM. No erythema, bruising or edema of the shoulder.   Neurological: She is alert.  Skin: Skin is warm and dry.  Psychiatric: She has a normal mood and affect. Her behavior is normal.    ED Course  Procedures (including critical care time)  COORDINATION OF CARE: 6:59 PM-Discussed treatment plan which includes xray with pt at bedside and pt agreed to plan.   Labs Review Labs Reviewed - No data to display  Imaging Review Dg Shoulder Right  07/29/2014   CLINICAL DATA:  [name] a pop.  Shoulder pain.  EXAM: RIGHT SHOULDER - 2+ VIEW  COMPARISON:  None.  FINDINGS: Osteoarthritic changes in the right  AC joint with joint space narrowing and spurring. Mild irregularity and spurring along the rotator cuff insertion at the greater tuberosity. Glenohumeral joint is maintained. No acute bony abnormality. Specifically, no fracture, subluxation, or dislocation. Soft tissues are intact.  IMPRESSION: Mild osteoarthritic changes.  No acute findings.   Electronically Signed   By: Rolm Baptise M.D.   On: 07/29/2014 19:15     EKG Interpretation None      MDM   Final diagnoses:  None   Patient presenting with right shoulder pain.  Xray  negative for acute findings.  Patient neurovascularly intact.  Patient stable for discharge.  Return precautions given.  I personally performed the services described in this documentation, which was scribed in my presence. The recorded information has been reviewed and is accurate.  Hyman Bible, PA-C 07/30/14 1247

## 2014-07-30 NOTE — ED Provider Notes (Signed)
Medical screening examination/treatment/procedure(s) were performed by non-physician practitioner and as supervising physician I was immediately available for consultation/collaboration.   EKG Interpretation None       Orlie Dakin, MD 07/30/14 [phone]

## 2014-08-05 ENCOUNTER — Other Ambulatory Visit: Payer: Self-pay | Admitting: Family Medicine

## 2014-08-05 DIAGNOSIS — Z1231 Encounter for screening mammogram for malignant neoplasm of breast: Secondary | ICD-10-CM

## 2014-08-09 ENCOUNTER — Ambulatory Visit (INDEPENDENT_AMBULATORY_CARE_PROVIDER_SITE_OTHER): Payer: Managed Care, Other (non HMO) | Admitting: Family Medicine

## 2014-08-09 ENCOUNTER — Encounter: Payer: Self-pay | Admitting: Family Medicine

## 2014-08-09 VITALS — BP 125/92 | HR 79 | Temp 98.9°F | Ht 62.0 in | Wt 224.0 lb

## 2014-08-09 DIAGNOSIS — K921 Melena: Secondary | ICD-10-CM | POA: Insufficient documentation

## 2014-08-09 LAB — CBC
HCT: 37.8 % (ref 36.0–46.0)
Hemoglobin: 13 g/dL (ref 12.0–15.0)
MCH: 29.1 pg (ref 26.0–34.0)
MCHC: 34.4 g/dL (ref 30.0–36.0)
MCV: 84.8 fL (ref 78.0–100.0)
Platelets: 357 10*3/uL (ref 150–400)
RBC: 4.46 MIL/uL (ref 3.87–5.11)
RDW: 14.3 % (ref 11.5–15.5)
WBC: 5.5 10*3/uL (ref 4.0–10.5)

## 2014-08-09 NOTE — Patient Instructions (Addendum)
It was nice to meet you today!  I am referring you to a GI doctor (stomach doctor) for your bleeding. You will need a colonoscopy. You will get a phone call to schedule this appointment.   Schedule an appointment to see Dr. Awanda Mink in about 3 weeks to follow up on your regular medical problems.  Be well, Dr. Ardelia Mems    Rectal Bleeding Rectal bleeding is when blood passes out of the anus. It is usually a sign that something is wrong. It may not be serious, but it should always be evaluated. Rectal bleeding may present as bright red blood or extremely dark stools. The color may range from dark red or maroon to black (like tar). It is important that the cause of rectal bleeding be identified so treatment can be started and the problem corrected. CAUSES   Hemorrhoids. These are enlarged (dilated) blood vessels or veins in the anal or rectal area.  Fistulas. Theseare abnormal, burrowing channels that usually run from inside the rectum to the skin around the anus. They can bleed.  Anal fissures. This is a tear in the tissue of the anus. Bleeding occurs with bowel movements.  Diverticulosis. This is a condition in which pockets or sacs project from the bowel wall. Occasionally, the sacs can bleed.  Diverticulitis. Thisis an infection involving diverticulosis of the colon.  Proctitis and colitis. These are conditions in which the rectum, colon, or both, can become inflamed and pitted (ulcerated).  Polyps and cancer. Polyps are non-cancerous (benign) growths in the colon that may bleed. Certain types of polyps turn into cancer.  Protrusion of the rectum. Part of the rectum can project from the anus and bleed.  Certain medicines.  Intestinal infections.  Blood vessel abnormalities. HOME CARE INSTRUCTIONS  Eat a high-fiber diet to keep your stool soft.  Limit activity.  Drink enough fluids to keep your urine clear or pale yellow.  Warm baths may be useful to soothe rectal  pain.  Follow up with your caregiver as directed. SEEK IMMEDIATE MEDICAL CARE IF:  You develop increased bleeding.  You have black or dark red stools.  You vomit blood or material that looks like coffee grounds.  You have abdominal pain or tenderness.  You have a fever.  You feel weak, nauseous, or you faint.  You have severe rectal pain or you are unable to have a bowel movement. MAKE SURE YOU:  Understand these instructions.  Will watch your condition.  Will get help right away if you are not doing well or get worse. Document Released: 05/24/2002 Document Revised: 02/24/2012 Document Reviewed: 05/19/2011 [facility] Patient Information 2015 [place], [place]. This information is not intended to replace advice given to you by your health care provider. Make sure you discuss any questions you have with your health care provider.

## 2014-08-09 NOTE — Progress Notes (Signed)
Patient ID: Mackenzie Nichols, female   DOB: 07-25-1956, 58 y.o.   MRN: [id number]  HPI:  Pt presents for a same day appointment to discuss rectal bleeding.  Has noticed intermittent blood in her stool for the last several months, but then earlier this month (a few weeks ago) began having blood with every stool. Thinks the blood is on the outside of the stool but says it could be mixed in. No pain in her rectum, fatigue, or shortness of breath. She does not take any blood thinners. Has never had a colonoscopy.   ROS: See HPI  : hx uterine fibroids, HTN, T2DM  PHYSICAL EXAM: BP 125/92  Pulse 79  Temp(Src) 98.9 F (37.2 C) (Oral)  Ht 5\' 2"  (1.575 m)  Wt 224 lb (101.606 kg)  BMI 40.96 kg/m2  LMP 06/15/2013 Gen: NAD HEENT: NCAT, MMM without lesions Heart: RRR, no murmurs Lungs: CTAB, NWOB Abdomen: obese, soft, nontender to palpation Neuro: grossly nonfocal, speech normal Rectal: anoscopy shows large external hemorrhoid present, mildly irritated rectal mucosa. Possible internal hemorrhoids. No gross blood in the rectal vault.  ASSESSMENT/PLAN:  Bloody stools Intermittent for a few months, occurring with each BM now for a few weeks. No signs of hemodynamic instability or systemic illness. Possibly due to hemorrhoids seen on exam today, but as pt has never had colonoscopy this needs to be investigated further. Plan: -refer to GI for colonoscopy -check CBC today -discussed red flags with pt (large volume bleeding, fatigue, SOB) which should prompt her to go to the ER.    FOLLOW UP: F/u in 3 weeks with PCP for routine medical problems Referring to GI separately  [place] J. Ardelia Mems, [place]

## 2014-08-09 NOTE — Assessment & Plan Note (Signed)
Intermittent for a few months, occurring with each BM now for a few weeks. No signs of hemodynamic instability or systemic illness. Possibly due to hemorrhoids seen on exam today, but as pt has never had colonoscopy this needs to be investigated further. Plan: -refer to GI for colonoscopy -check CBC today -discussed red flags with pt (large volume bleeding, fatigue, SOB) which should prompt her to go to the ER.

## 2014-08-11 ENCOUNTER — Encounter: Payer: Self-pay | Admitting: Family Medicine

## 2014-08-16 ENCOUNTER — Ambulatory Visit: Payer: Self-pay | Admitting: Family Medicine

## 2014-08-16 ENCOUNTER — Encounter: Payer: Self-pay | Admitting: Gastroenterology

## 2014-08-18 ENCOUNTER — Ambulatory Visit (HOSPITAL_COMMUNITY)
Admission: RE | Admit: 2014-08-18 | Discharge: 2014-08-18 | Disposition: A | Discharge status: Home / Self Care | Payer: Managed Care, Other (non HMO) | Source: Ambulatory Visit | Attending: Family Medicine | Admitting: Family Medicine

## 2014-08-18 DIAGNOSIS — Z1231 Encounter for screening mammogram for malignant neoplasm of breast: Secondary | ICD-10-CM | POA: Diagnosis present

## 2014-09-02 ENCOUNTER — Telehealth: Payer: Self-pay | Admitting: Family Medicine

## 2014-09-02 NOTE — Telephone Encounter (Signed)
Pt called and needs a refill on her Amlodipine called in. jw °

## 2014-09-03 MED ORDER — AMLODIPINE BESYLATE 5 MG PO TABS: 5.0000 mg | ORAL_TABLET | Freq: Every day | ORAL | Status: DC

## 2014-09-03 NOTE — Telephone Encounter (Signed)
Called in, please let pt know she needs an appointment upon next refill.  Thanks, Tamela Oddi. Awanda Mink, DO of [facility] 09/03/2014, 8:50 AM

## 2014-09-16 ENCOUNTER — Encounter: Payer: Self-pay | Admitting: Family Medicine

## 2014-09-16 ENCOUNTER — Ambulatory Visit (INDEPENDENT_AMBULATORY_CARE_PROVIDER_SITE_OTHER): Payer: Managed Care, Other (non HMO) | Admitting: Family Medicine

## 2014-09-16 VITALS — BP 135/75 | HR 87 | Temp 97.9°F | Ht 62.0 in | Wt 221.8 lb

## 2014-09-16 DIAGNOSIS — M25511 Pain in right shoulder: Secondary | ICD-10-CM | POA: Insufficient documentation

## 2014-09-16 DIAGNOSIS — S4991XD Unspecified injury of right shoulder and upper arm, subsequent encounter: Secondary | ICD-10-CM

## 2014-09-16 NOTE — Assessment & Plan Note (Signed)
Patient does have some weakness with infraspinatus/teres minor testing.  This is likely reflective of mild tendinitis/tendinopathy. I advised PRN use of Tylenol. Patient to return if she worsens as she may benefit from cortisone injection. Patient cleared for full active duty at work, with close followup if she worsens.

## 2014-09-16 NOTE — Progress Notes (Signed)
   Subjective:    Patient ID: Mackenzie Nichols, female    DOB: February 21, 1956, 58 y.o.   MRN: [id number]  HPI 58 year old female presents to the clinic today requesting clearance to return back to full activity at work.  Patient reports that she suffered a right shoulder injury on 8/14 while lifting a patient at work (she works at [employer]).  She was seen in the ED following injury.  X-rays were negative for acute fracture or dislocation.  Patient subsequently underwent treatment with physical therapy.    Patient currently reports that she is doing well.  She still has some pain particularly with certain ranges of motion.  Pain is mild in severity and intermittent. She has not taken any medication for this as it is not bothering her very much.  She feels like she is ready to return to active duty at work, especially after having completed physical therapy.   Review of Systems Per HPI    Objective:   Physical Exam Filed Vitals:   09/16/14 0839  BP: 135/75  Pulse: 87  Temp: 97.9 F (36.6 C)   General: Well-appearing obese female in no acute distress. Shoulder: Right Inspection reveals no abnormalities, atrophy or asymmetry. Palpation is normal with no tenderness over AC joint or bicipital groove. ROM is full in all planes. Rotator cuff strength - 5/5 Supraspinatus/Subscapularis; 4/5 Infraspinatus and teres minor. No signs of impingement with negative Hawkin's tests, empty can. No painful arc and no drop arm sign.    Assessment & Plan:  See Problem List

## 2014-09-21 ENCOUNTER — Ambulatory Visit: Payer: Self-pay | Admitting: Family Medicine

## 2014-10-12 ENCOUNTER — Ambulatory Visit: Payer: Self-pay | Admitting: Gastroenterology

## 2014-10-17 ENCOUNTER — Encounter: Payer: Self-pay | Admitting: Family Medicine

## 2014-10-18 ENCOUNTER — Encounter: Payer: Self-pay | Admitting: Family Medicine

## 2014-10-18 ENCOUNTER — Ambulatory Visit (INDEPENDENT_AMBULATORY_CARE_PROVIDER_SITE_OTHER): Payer: Managed Care, Other (non HMO) | Admitting: Family Medicine

## 2014-10-18 VITALS — BP 142/82 | HR 79 | Temp 97.8°F | Wt 221.0 lb

## 2014-10-18 DIAGNOSIS — S43421D Sprain of right rotator cuff capsule, subsequent encounter: Secondary | ICD-10-CM

## 2014-10-18 DIAGNOSIS — S4991XD Unspecified injury of right shoulder and upper arm, subsequent encounter: Secondary | ICD-10-CM

## 2014-10-18 NOTE — Progress Notes (Signed)
   Subjective:    Patient ID: Mackenzie Nichols, female    DOB: [birth year], 58 y.o.   MRN: [id number]  HPI  SDA  CC: Right shoulder pain  # Right shoulder pain:  Had injury at work lifting patient in August, went to ED and has been evaluated by (insurance?) doctor as well for this problem. Requested clearance from our clinic 1 month ago to return to full time work.  Had PT when injury first occurred, has been doing some exercises since that time without much relief  She has had persistent pain in the shoulder while at work (at [employer], CMA/aid and lifts patients).  She does not take anything for the pain, does not ice the area.  Pain with virtually any movement ROS: no numbness/tingling of the arm  Review of Systems   See HPI for ROS. All other systems reviewed and are negative.  Past medical history, surgical, family, and social history reviewed and updated in the EMR as appropriate. Objective:  BP 142/82 mmHg  Pulse 79  Temp(Src) 97.8 F (36.6 C) (Oral)  Wt 221 lb (100.245 kg)  LMP 06/15/2013 Vitals reviewed  General: NAD CV: RRR, normal s1s2 no murmur Resp: CTAB Ext: mildly tender right shoulder, no specific point tenderness including AC joint and biceps groove, no deformity noted. She has markedly limited ROM in any direction due to pain. Abduction to 60*. Empty can equivocal (pt unable to abduct arm fully)  Assessment & Plan:  See Problem List Documentation

## 2014-10-18 NOTE — Assessment & Plan Note (Signed)
Pt appeared to be improving before returning to work, and now c/o inability to function at work, however she does also decline any OTC pain medications as she doesn't like to take meds. Unable to fully assess rot cuff as she is unable to participate in most of the exam. She had normal x-rays at the time of injury, and was initially supposed to get an MRI but insurance declined payment. Given the degree of difficulty/pain she is having today I have made a referral to sports medicine. Encouraged continued exercises, OTC pain mgmt, icing the shoulder. Work note signed to take out until 10/25/14.

## 2014-10-18 NOTE — Patient Instructions (Signed)
We will make a referral to sports medicine to evaluate your shoulder.   Continue to do the exercises, ice the shoulder, and I would recommend starting some OTC pain medications like tylenol.

## 2014-10-20 ENCOUNTER — Telehealth: Payer: Self-pay | Admitting: Family Medicine

## 2014-10-20 NOTE — Telephone Encounter (Signed)
Pt received letter yesterday stating she could return to work 10-25-14.  She doesn't have an appt at [facility] until 11-02-14. She needs a letter stating she cannot return to work until after her appt on the 18th. She can pick up the letter when it is ready

## 2014-10-23 NOTE — Telephone Encounter (Signed)
Dr. Lamar Benes wrote the note and will need to approve.  Thanks, Tamela Oddi. Awanda Mink, DO of [facility] 10/23/2014, 4:27 PM

## 2014-10-24 NOTE — Telephone Encounter (Signed)
Called about her letter today. Needs it today to take to her employer

## 2014-10-24 NOTE — Telephone Encounter (Signed)
Will check with Dr. Lamar Benes on this letter. Jazmin Hartsell,CMA

## 2014-10-25 ENCOUNTER — Encounter: Payer: Self-pay | Admitting: Family Medicine

## 2014-10-25 NOTE — Telephone Encounter (Signed)
Letter printed and left at the front, ready for pickup. -Dr. Lamar Benes

## 2014-10-25 NOTE — Telephone Encounter (Signed)
Pt is aware of this and will pick up letter tomorrow. Zaeda Mcferran,CMA

## 2014-10-26 ENCOUNTER — Ambulatory Visit: Payer: Self-pay | Admitting: Sports Medicine

## 2014-11-02 ENCOUNTER — Ambulatory Visit (INDEPENDENT_AMBULATORY_CARE_PROVIDER_SITE_OTHER): Payer: Managed Care, Other (non HMO) | Admitting: Sports Medicine

## 2014-11-02 ENCOUNTER — Encounter: Payer: Self-pay | Admitting: Sports Medicine

## 2014-11-02 VITALS — BP 143/92 | Ht 61.0 in | Wt 220.0 lb

## 2014-11-02 DIAGNOSIS — M25511 Pain in right shoulder: Secondary | ICD-10-CM

## 2014-11-02 MED ORDER — MELOXICAM 15 MG PO TABS: 15.0000 mg | ORAL_TABLET | Freq: Every day | ORAL | Status: DC

## 2014-11-02 NOTE — Progress Notes (Signed)
Mackenzie Nichols - 58 y.o. female MRN [id number]  Date of birth: January 02, 1956  CC & HPI:  No chief complaint on file.   Patient is referred today by [facility] for evaluation of: Right Anterior Shoulder Pain: right anterior shoulder pain began following an incident and of this year while she was at work lifting a patient. She felt a pop and had subsequent onset of severe pain. She's had weakness, loss of range of motion and pain since that time. She has been tried physical therapy. She has not tried anti-inflammatories due to concerns around taking medications. She has been able to return to work however has had significant limitations and persistent discomfort that results in disordered sleep and interference with everyday activities.  She was initially seen at the employer health services and has previously been seen at [facility]. She has not been interested in injection or further evaluation until now but is here today due to her husband being concerned that she is not improving.   ROS:  Per HPI.   HISTORY: Past Medical, Surgical, Social, and Family History Reviewed & Updated per EMR.  Pertinent Historical Findings include: Hypertension, type 2 diabetes, fairly well controlled, hyperlipidemia, nonsmoker Previous hernia repair otherwise no orthopedic surgeries. Works at [employer] as a Quarry manager. Prior blood-streaked stool but no prior episodes of melena or gross hematochezia. Upcoming colonoscopy scheduled.   Historical Data Reviewed: Two-view x-ray of the shoulder from 07/29/2014 reveal overall well maintained joint space. She does have osteophytic spurring of the before meals joint. No fracture dislocation.   OBJECTIVE:  VS:   HT:5\' 1"  (154.9 cm)   WT:220 lb (99.791 kg)  BMI:41.7          BP:(!) 143/92 mmHg  HR: bpm  TEMP: ( )  RESP:   PHYSICAL EXAM: GENERAL: Adult obese African-American  female. In no discomfort; no respiratory distress   PSYCH: alert and appropriate,  good insight   NEURO: sensation is intact to light touch in bilateral upper extremities  VASCULAR: Bilateral Radial pulses 2+/4.  No significant edema.    Right shoulder EXAM: Appearance: Overall grossly normal-appearing, questionable Popeye deformity with palpation,   Skin: No overlying erythema/ecchymosis.  Palpation: TTP over: bicipital groove, paralysis insertion, before meals joint, posterior and lateral shoulder  No TTP over: proximal clavicle  Strength & ROM: Patient is able to reach her head as well as to L4 but has significant pain in doing so. At 30 of abduction she has 60 of external rotation 40 of internal rotation. Strength exam is limited due to significant discomfort but internal and external rotation, abduction 4/5. Pain with empty can, speeds, [place], [employer]. Elbow flexion, extension, supination normal and 5/5 strength.  Special Tests: Slight crepitation with axial loading over the before meals joint. Bicipital  Hook test normal.    ASSESSMENT: No diagnosis found. Concern for potential Biceps Tendon tear/rupture vs tendinitis. Overall Limited shoulder exam due to pain level. - In-depth discussion regarding management options and patient would like to avoid injection at this time or any surgical intervention. She does not like taking medications but is not opposed to a short course of anti-inflammatories  PLAN: See problem based charting & AVS for additional documentation.  Mobic X 14 days then prn.  Discussed rational behind anti-inflammatory use and patient agreeable to trying. Red flags for NSAID use discussed.  Unfortunately body habitus and pain with positioning limits diagnostic musculoskeletal ultrasound. Could consider at follow-up depending on pain levels versus consideration of  MRI. > if Not improved discussed subacromial CSI injection and pt would consider it at that time. > Return in about 2 weeks (around 11/16/2014).

## 2014-11-02 NOTE — Patient Instructions (Signed)
It was nice to meet you today. Please start the medication that we discussed. Take this with food. Follow-up in 2 weeks for reevaluation and to discuss ongoing management. I have given you a letter today for work.

## 2014-11-16 ENCOUNTER — Ambulatory Visit (INDEPENDENT_AMBULATORY_CARE_PROVIDER_SITE_OTHER): Payer: Managed Care, Other (non HMO) | Admitting: Sports Medicine

## 2014-11-16 ENCOUNTER — Encounter: Payer: Self-pay | Admitting: Sports Medicine

## 2014-11-16 VITALS — BP 144/89 | Ht 61.0 in | Wt 222.0 lb

## 2014-11-16 DIAGNOSIS — M25511 Pain in right shoulder: Secondary | ICD-10-CM

## 2014-11-16 MED ORDER — METHYLPREDNISOLONE ACETATE 40 MG/ML IJ SUSP
40.0000 mg | Freq: Once | INTRAMUSCULAR | Status: AC
  Administered 2014-11-16: 40 mg via INTRA_ARTICULAR

## 2014-11-16 NOTE — Progress Notes (Signed)
  Mackenzie Nichols - 58 y.o. female MRN [id number]  Date of birth: 09-27-1956  CC & HPI:  No chief complaint on file.  Mackenzie Nichols is here to follow up: Right shoulder pain: Reports overall good improvement with 2 weeks of meloxicam. She is still having significant limitations in her range of motion and strength with lifting. Overall however significantly improved. She denies any numbness, tingling or hand symptoms. Occasional pain down the lateral aspect of her right arm. Pain with overhead motion and pain over the anterior aspect of her shoulder.  ROS:  Per HPI.   HISTORY: Past Medical, Surgical, Social, and Family History Reviewed & Updated per EMR.  Historical Data Reviewed: X-ray right shoulder 07/29/2014: Mild OA especially at the [facility] joint.   OBJECTIVE:  VS:   HT:5\' 1"  (154.9 cm)   WT:222 lb (100.699 kg)  BMI:42          BP:(!) 144/89 mmHg  HR: bpm  TEMP: ( )  RESP:   PHYSICAL EXAM: GENERAL:  African-American obese female. In no discomfort; no respiratory distress   PSYCH: alert and appropriate, good insight   NEURO: sensation is intact to light touch in bilateral upper extremities. Negative Spurling's compression test   VASCULAR:  bilateral radial pulses 2+/4.  No significant edema.    Right shoulder EXAM: Appearance:  overall normal-appearing no significant deformity . No specific Popeye deformity noted   Skin: No overlying erythema/ecchymosis.  Palpation: TTP over: Anterior aspect of shoulder. But no focality No TTP over: Before meals joint, pectoralis insertion, supraspinatus or brachial plexus   Strength, ROM & OtherTests:  4/5 strength with internal rotation, external rotation of the right arm, normal with the left. No pain or clicking with axial loading of the right shoulder. Positive cross arm test, Hawkins and Neers. Negative speeds test, negative O'Brien's. Minimal pain with empty can and strength is 5/5   ASSESSMENT: 1. Right shoulder pain    Suspect right shoulder  impingement with possible biceps tendinitis with moderate improvement on oral anti-inflammatories but still symptomatic.  PROCEDURE NOTE : Right Subacromial Injection After discussing the risks, benefits and expected outcomes of the injection and all questions were reviewed and answered,  she wished to undergo the above named procedure.  Written consent was obtained. After an appropriate time out was taken the right posterior shoulder was sterilely prepped and injected as below: Prep:    Betadine and alcohol,  Ethel chloride.  Approach:  Posterior Needle:  22g 1.5inch Meds:   4cc 1% lidocaine, 1cc 40mg  depomedrol A bandaid was applied to the area. This procedure was well tolerated and there were no complications.    PLAN: See problem based charting & AVS for additional documentation.  Injection as above  Discussed returning to work with limited lifting.   If symptoms do not resolve will need MRI of the shoulder to further evaluate for potential infraspinatus and/or biceps involvement. > Return in about 6 weeks (around 12/28/2014).

## 2014-11-16 NOTE — Patient Instructions (Signed)
Joint Injection  Care After  Refer to this sheet in the next few days. These instructions provide you with information on caring for yourself after you have had a joint injection. Your caregiver also may give you more specific instructions. Your treatment has been planned according to current medical practices, but problems sometimes occur. Call your caregiver if you have any problems or questions after your procedure.  After any type of joint injection, it is not uncommon to experience:  · Soreness, swelling, or bruising around the injection site.  · Mild numbness, tingling, or weakness around the injection site caused by the numbing medicine used before or with the injection.  It also is possible to experience the following effects associated with the specific agent after injection:  · Iodine-based contrast agents:  ¨ Allergic reaction (itching, hives, widespread redness, and swelling beyond the injection site).  · Corticosteroids (These effects are rare.):  ¨ Allergic reaction.  ¨ Increased blood sugar levels (If you have diabetes and you notice that your blood sugar levels have increased, notify your caregiver).  ¨ Increased blood pressure levels.  ¨ Mood swings.  · Hyaluronic acid in the use of viscosupplementation.  ¨ Temporary heat or redness.  ¨ Temporary rash and itching.  ¨ Increased fluid accumulation in the injected joint.  These effects all should resolve within a day after your procedure.   HOME CARE INSTRUCTIONS  · Limit yourself to light activity the day of your procedure. Avoid lifting heavy objects, bending, stooping, or twisting.  · Take prescription or over-the-counter pain medication as directed by your caregiver.  · You may apply ice to your injection site to reduce pain and swelling the day of your procedure. Ice may be applied 03-04 times:  ¨ Put ice in a plastic bag.  ¨ Place a towel between your skin and the bag.  ¨ Leave the ice on for no longer than 15-20 minutes each time.  SEEK  IMMEDIATE MEDICAL CARE IF:   · Pain and swelling get worse rather than better or extend beyond the injection site.  · Numbness does not go away.  · Blood or fluid continues to leak from the injection site.  · You have chest pain.  · You have swelling of your face or tongue.  · You have trouble breathing or you become dizzy.  · You develop a fever, chills, or severe tenderness at the injection site that last longer than 1 day.  MAKE SURE YOU:  · Understand these instructions.  · Watch your condition.  · Get help right away if you are not doing well or if you get worse.  Document Released: 08/15/2011 Document Revised: 02/24/2012 Document Reviewed: 08/15/2011  ExitCare® Patient Information ©2015 [employer]. This information is not intended to replace advice given to you by your health care provider. Make sure you discuss any questions you have with your health care provider.

## 2014-11-23 ENCOUNTER — Ambulatory Visit: Payer: Managed Care, Other (non HMO) | Admitting: Family Medicine

## 2014-11-28 ENCOUNTER — Other Ambulatory Visit: Payer: Self-pay | Admitting: Family Medicine

## 2014-12-12 ENCOUNTER — Encounter: Payer: Self-pay | Admitting: Gastroenterology

## 2014-12-12 ENCOUNTER — Ambulatory Visit (INDEPENDENT_AMBULATORY_CARE_PROVIDER_SITE_OTHER): Payer: Managed Care, Other (non HMO) | Admitting: Gastroenterology

## 2014-12-12 VITALS — BP 134/78 | HR 72 | Ht 62.5 in | Wt 224.5 lb

## 2014-12-12 DIAGNOSIS — K649 Unspecified hemorrhoids: Secondary | ICD-10-CM

## 2014-12-12 DIAGNOSIS — K921 Melena: Secondary | ICD-10-CM

## 2014-12-12 MED ORDER — MOVIPREP 100 G PO SOLR: ORAL | Status: DC

## 2014-12-12 NOTE — Patient Instructions (Signed)
You have been scheduled for a colonoscopy. Please follow written instructions given to you at your visit today.  Please pick up your prep kit at the pharmacy within the next 1-3 days. If you use inhalers (even only as needed), please bring them with you on the day of your procedure. Your physician has requested that you go to www.startemmi.com and enter the access code given to you at your visit today. This web site gives a general overview about your procedure. However, you should still follow specific instructions given to you by our office regarding your preparation for the procedure.   I appreciate the opportunity to care for you.

## 2014-12-12 NOTE — Progress Notes (Signed)
History of Present Illness: This is a 58 year old female with a several month history of hematochezia. She describes small amounts of bright red blood per rectum with bowel movements. Her symptoms occur 2-3 times per week. She was evaluated by her PCP in August and underwent anoscopy showing a large external hemorrhoid and possible internal hemorrhoids. CBC in August was normal. Denies weight loss, abdominal pain, constipation, diarrhea, change in stool caliber, melena, nausea, vomiting, dysphagia, reflux symptoms, chest pain.  Allergies  Allergen Reactions  . Clindamycin/Lincomycin Hives   Outpatient Prescriptions Prior to Visit  Medication Sig Dispense Refill  . amLODipine (NORVASC) 5 MG tablet TABLET 1 TABLET BY MOUTH DAILY 90 tablet 3  . CINNAMON PO Take by mouth.    . meloxicam (MOBIC) 15 MG tablet Take 1 tablet (15 mg total) by mouth daily. Take 1 pill daily X 14 days then as needed 30 tablet 0  . Multiple Vitamin (MULTIVITAMIN WITH MINERALS) TABS tablet Take 1 tablet by mouth daily.    . traMADol (ULTRAM) 50 MG tablet Take 1 tablet (50 mg total) by mouth every 6 (six) hours as needed. 15 tablet 0  . triamcinolone cream (KENALOG) 0.1 %   2   No facility-administered medications prior to visit.   Past Medical History  Diagnosis Date  . Hypertension   . Diabetes mellitus, type 2   . Hyperlipidemia   . Obesity   . Abnormal uterine bleeding    Past Surgical History  Procedure Laterality Date  . Hernia repair    . Wisdom tooth extraction     History   Social History  . Marital Status: Married    Spouse Name: [name]    Number of Children: 6  . Years of Education: [name]   Occupational History  . CNA    Social History Main Topics  . Smoking status: Never Smoker   . Smokeless tobacco: Never Used  . Alcohol Use: No  . Drug Use: No  . Sexual Activity: None   Other Topics Concern  . None   Social History Narrative   Family History  Problem Relation Age of Onset  .  Diabetes Mother   . Diabetes Father   . Diabetes Sister   . Heart disease Father   . Colon cancer Neg Hx   . Colon polyps Neg Hx   . Kidney disease Neg Hx   . Esophageal cancer Neg Hx   . Gallbladder disease Neg Hx     Review of Systems: Pertinent positive and negative review of systems were noted in the above HPI section. All other review of systems were otherwise negative.  Physical Exam: General: Well developed , well nourished, no acute distress Head: Normocephalic and atraumatic Eyes:  sclerae anicteric, EOMI Ears: Normal auditory acuity Mouth: No deformity or lesions Neck: Supple, no masses or thyromegaly Lungs: Clear throughout to auscultation Heart: Regular rate and rhythm; no murmurs, rubs or bruits Abdomen: Soft, non tender and non distended. No masses, hepatosplenomegaly or hernias noted. Normal Bowel sounds Rectal: Anoscopy showed external hemorrhoid and possible internal hemorrhoids. Deferred DRE to colonoscopy Musculoskeletal: Symmetrical with no gross deformities  Skin: No lesions on visible extremities Pulses:  Normal pulses noted Extremities: No clubbing, cyanosis, edema or deformities noted Neurological: Alert oriented x 4, grossly nonfocal Cervical Nodes:  No significant cervical adenopathy Inguinal Nodes: No significant inguinal adenopathy Psychological:  Alert and cooperative. Normal mood and affect  Assessment and Recommendations:  1. Small volume, persistent hematochezia. Rule out colorectal  neoplasms and other sources of bleeding. Hemorrhoids are a potential source of bleeding. The risks, benefits, and alternatives to colonoscopy with possible biopsy, possible polypectomy and possible injection of hemorrhoids were discussed with the patient and they consent to proceed.    2. DM2  3. Obesity

## 2014-12-19 ENCOUNTER — Ambulatory Visit: Payer: Self-pay | Admitting: Family Medicine

## 2014-12-21 ENCOUNTER — Encounter: Payer: Self-pay | Admitting: Gastroenterology

## 2014-12-21 ENCOUNTER — Encounter: Payer: Self-pay | Admitting: Sports Medicine

## 2014-12-21 ENCOUNTER — Ambulatory Visit (INDEPENDENT_AMBULATORY_CARE_PROVIDER_SITE_OTHER): Payer: Managed Care, Other (non HMO) | Admitting: Sports Medicine

## 2014-12-21 VITALS — BP 126/71 | HR 81 | Ht 61.0 in | Wt 220.0 lb

## 2014-12-21 DIAGNOSIS — M25511 Pain in right shoulder: Secondary | ICD-10-CM

## 2014-12-21 DIAGNOSIS — K921 Melena: Secondary | ICD-10-CM

## 2014-12-21 MED ORDER — NAPROXEN 500 MG PO TABS: 500.0000 mg | ORAL_TABLET | Freq: Two times a day (BID) | ORAL | Status: DC | PRN

## 2014-12-21 NOTE — Patient Instructions (Signed)
Do your exercises per the hand out Call me or send me a Mychart message in 4 weeks and let me how things are going. If you are having problems sooner than that I would want to get an MRI of your shoulder and see you after this to talk about options.  Letter to return to work

## 2014-12-21 NOTE — Progress Notes (Signed)
  Mackenzie Nichols - 59 y.o. female MRN [id number]  Date of birth: December 07, 1956  CC & HPI:  Patient presents for reevaluation of: Right shoulder pain: Following the injection at the last visit she reports overall significantly feeling better and no longer having interference with her daily activity. No nighttime awakenings. Full range of motion regained but still some strength deficit. 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 She has been doing some home stretching but no specific therapeutic exercises. Meloxicam has been helpful but she has recently had issues with hematochezia and is scheduled for colonoscopy in February.  ROS:  Per HPI.   HISTORY: Past Medical, Surgical, Social, and Family History Reviewed & Updated per EMR.  Pertinent Historical Findings include: Diabetes, nonsmoker Works as a Quarry manager at [employer]   OBJECTIVE:  VS:   HT:5\' 1"  (154.9 cm)   WT:220 lb (99.791 kg)  BMI:41.7          BP:126/71 mmHg  HR:81bpm  TEMP: ( )  RESP:   PHYSICAL EXAM: GENERAL: Adult obese African American  female. In no discomfort; no respiratory distress   PSYCH: alert and appropriate, good insight   NEURO: sensation is intact to light touch in RUE  VASCULAR: Radial pulses 2+/4.  No significant edema.    RIGHT SHOULDER EXAM: Appearance: Normal alignment; normal contours  Skin: No overlying erythema/ecchymosis.  Palpation: TTP over: CA lig & minimal over long head of biceps  Strength, ROM & OtherTests: 5/5 in RUE Painful empty can and strength 4 out of 5, left 5/5 External rotation strength 4 out of 5, internal rotation 5 out of 5 Speeds test nonpainful.    ASSESSMENT: 1. Right shoulder pain   2. Bloody stools    Likely some rotator cuff pathology however patient is hesitant for further evaluation but understands that MRI as the next step. She will like to try to return to work and have cautioned that this is potentially going to  exacerbate her symptoms but is worth a trial prior to further intervention.  PLAN: See problem based charting & AVS for additional documentation.  Change from meloxicam to naproxen for shorter duration given hematochezia. Discussed these risks and patient aware of NSAID use and hematochezia.  HEP: Wagon wheel exercises avoiding above the plane of the shoulder or neck sets of 90 of external rotation  Patient will call or my chart message in 4 weeks to ensure we are doing better  If she has worsening of her symptoms will need MRI of the right shoulder prior to next visit and we can arrange this without a second office visit. Body habitus limits utility of MSK ultrasound > Return if symptoms worsen or fail to improve.

## 2015-01-12 IMAGING — MG MM DIGITAL SCREENING BILAT
4 series · 4 of 4 positions shown · non-contrast
Comparison: Previous exam(s)

ACR Breast Density Category a: The breast tissue is almost entirely
fatty.

CLINICAL DATA: Screening.

EXAM:
DIGITAL SCREENING BILATERAL MAMMOGRAM WITH CAD

[R CC]
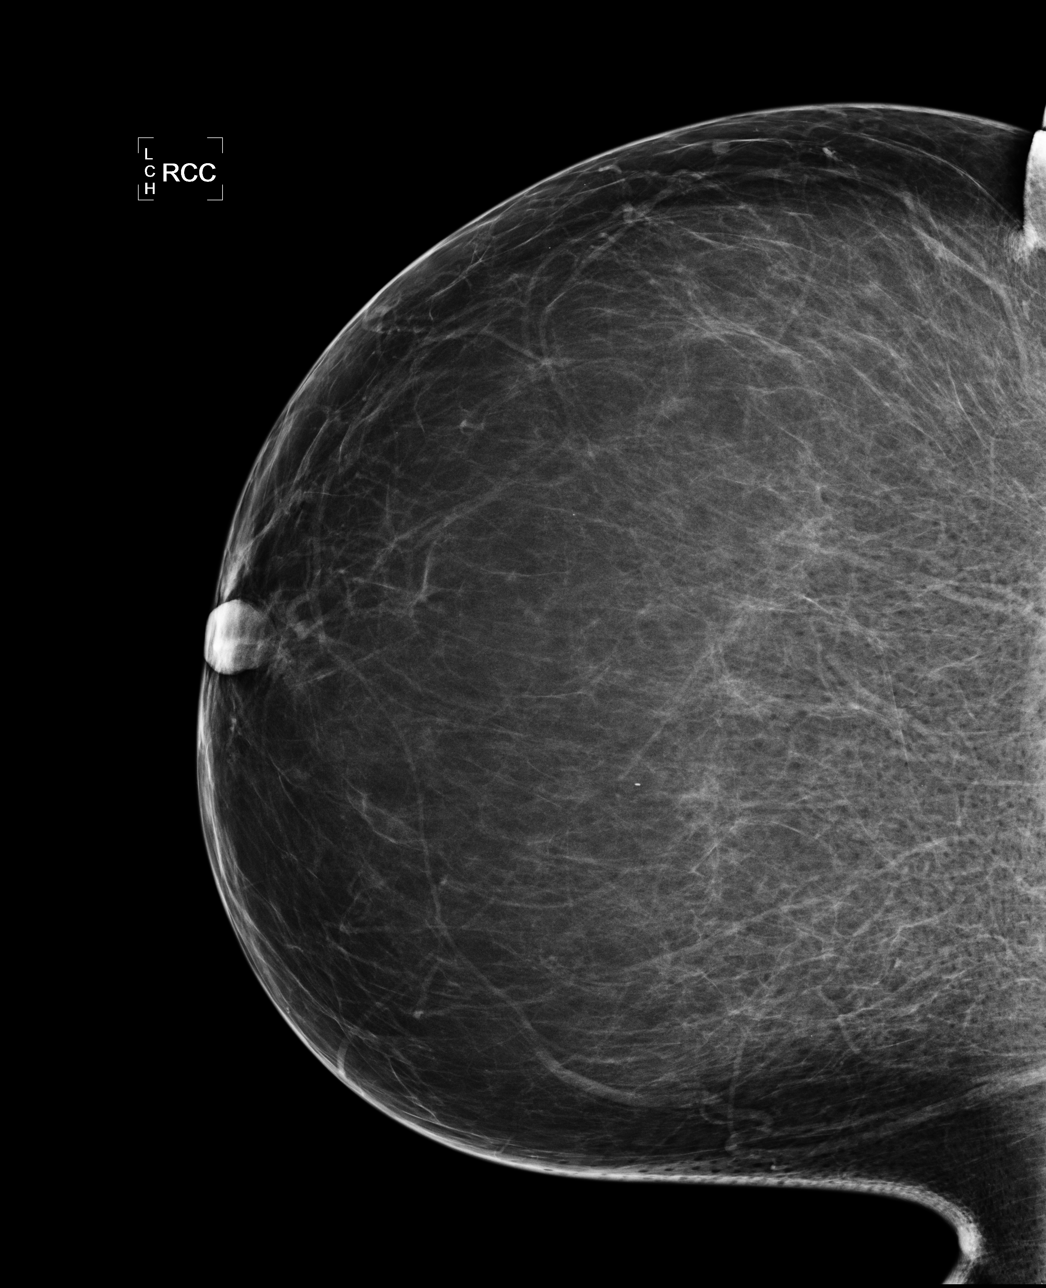

[R MLO]
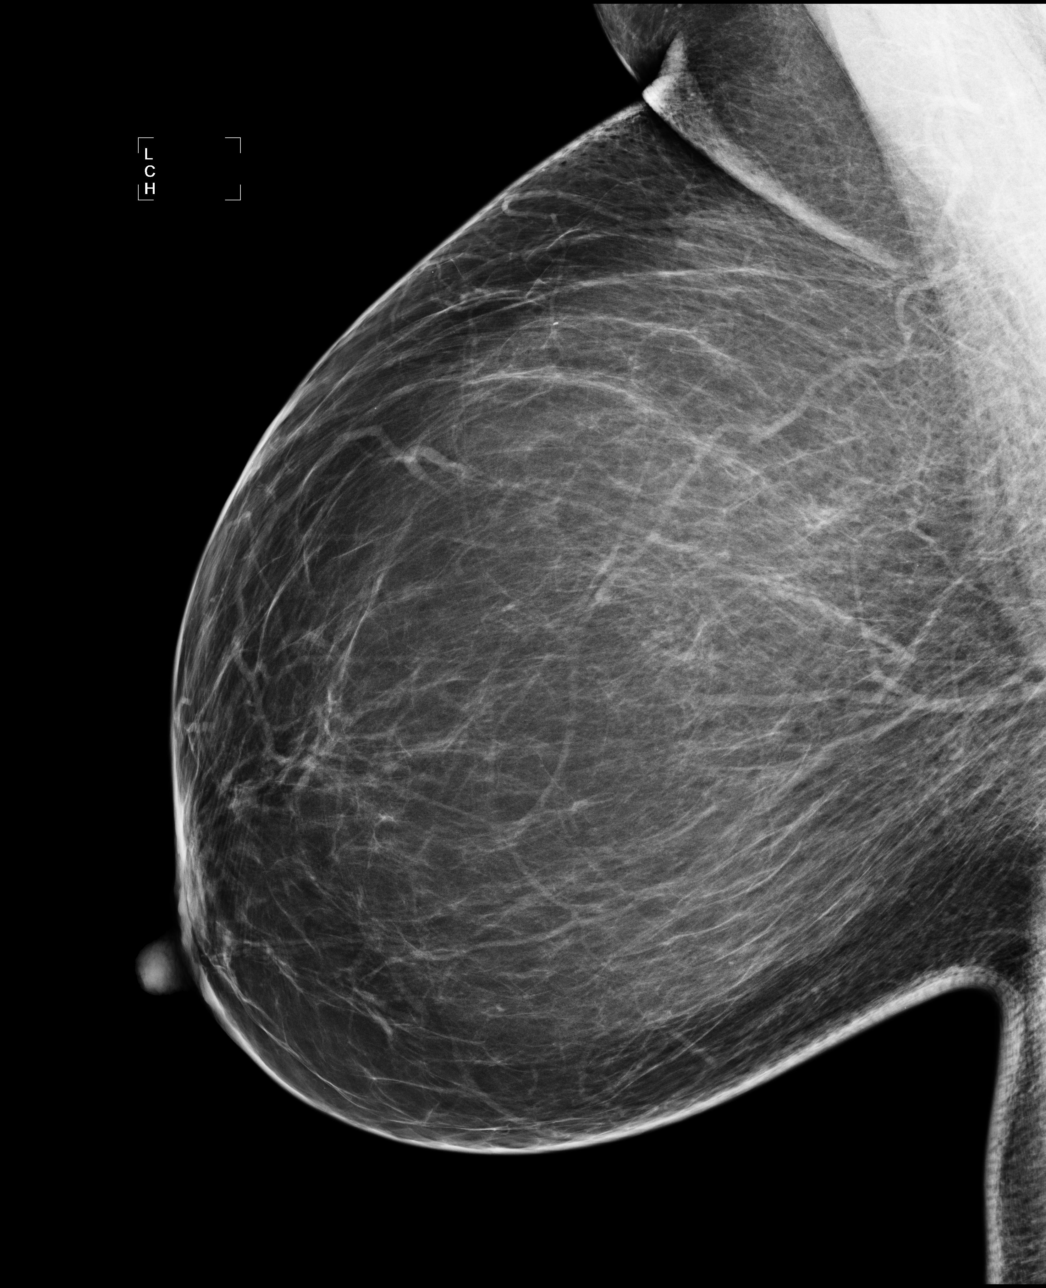

[L CC]
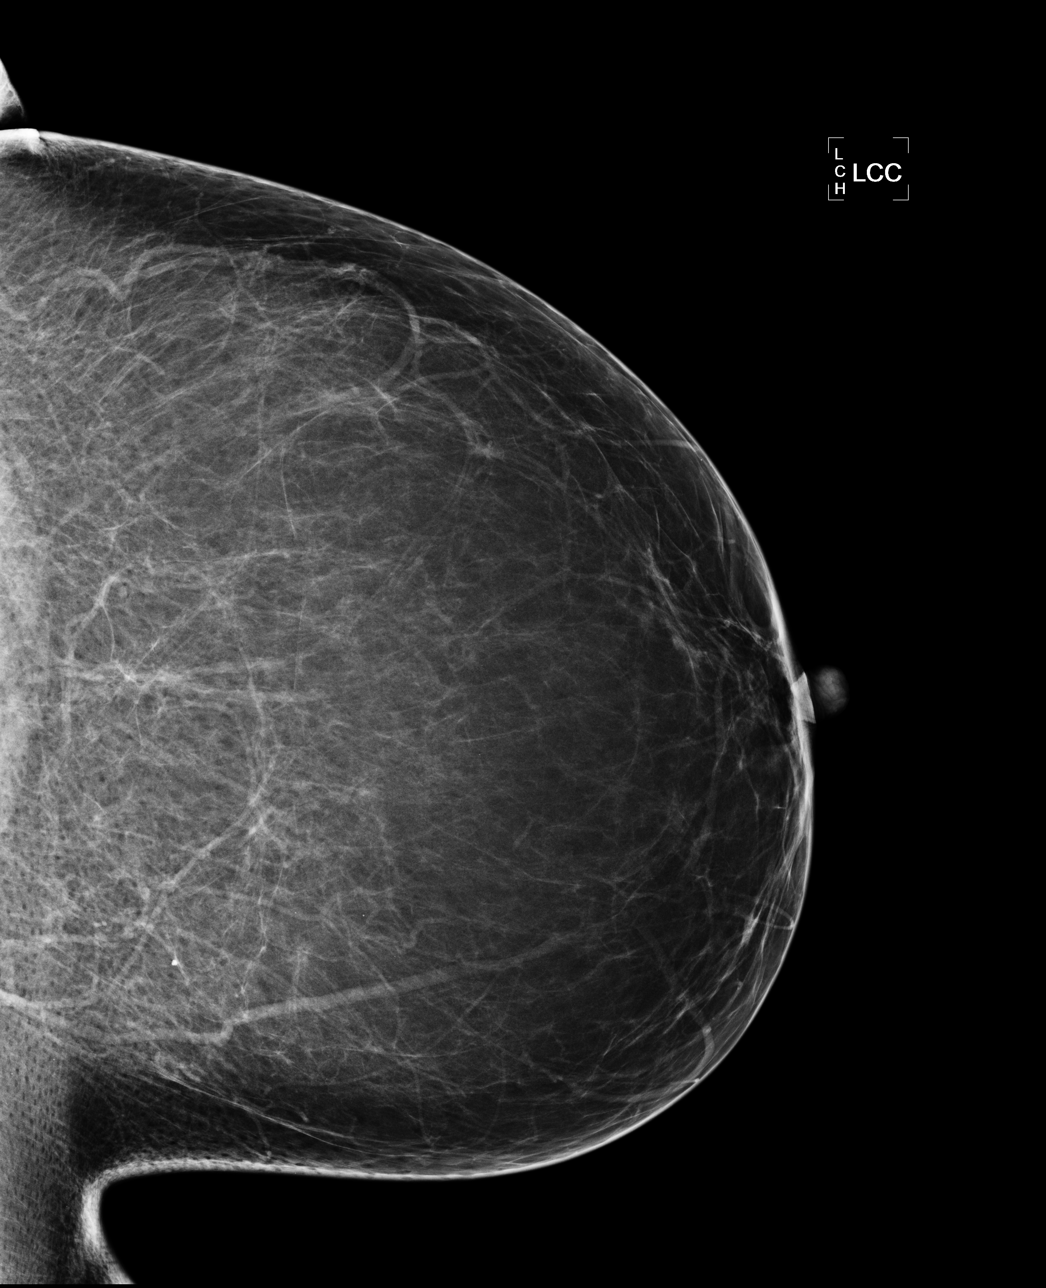

[L MLO]
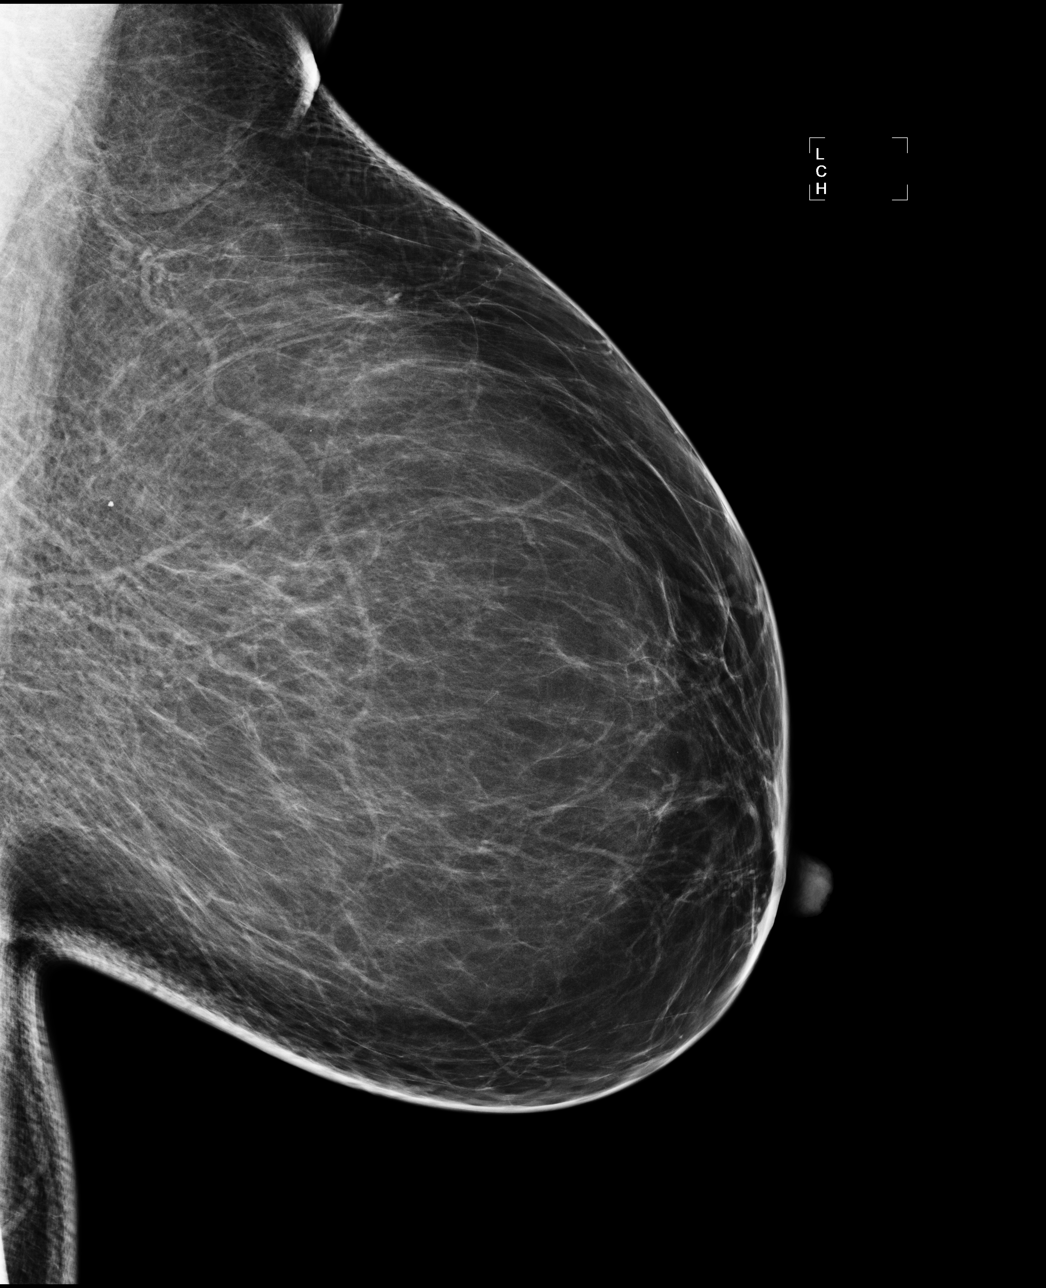

[4 of 4 positions shown; findings below may reference images not displayed]

FINDINGS: There are no findings suspicious for malignancy. Images were
processed with CAD.
IMPRESSION: No mammographic evidence of malignancy. A result letter of this
screening mammogram will be mailed directly to the patient.

RECOMMENDATION:
Screening mammogram in one year. (Code:JV-X-DYE)

BI-RADS CATEGORY  1: Negative.

## 2015-01-16 ENCOUNTER — Ambulatory Visit: Payer: Self-pay | Admitting: Family Medicine

## 2015-01-17 ENCOUNTER — Ambulatory Visit (AMBULATORY_SURGERY_CENTER): Payer: Managed Care, Other (non HMO) | Admitting: Gastroenterology

## 2015-01-17 ENCOUNTER — Encounter: Payer: Self-pay | Admitting: Gastroenterology

## 2015-01-17 VITALS — BP 130/71 | HR 55 | Temp 97.5°F | Resp 14 | Ht 62.5 in | Wt 224.0 lb

## 2015-01-17 DIAGNOSIS — K921 Melena: Secondary | ICD-10-CM

## 2015-01-17 DIAGNOSIS — K648 Other hemorrhoids: Secondary | ICD-10-CM

## 2015-01-17 LAB — GLUCOSE, CAPILLARY: Glucose-Capillary: 96 mg/dL (ref 70–99)

## 2015-01-17 MED ORDER — SODIUM CHLORIDE 0.9 % IV SOLN: 500.0000 mL | INTRAVENOUS | Status: DC

## 2015-01-17 NOTE — Progress Notes (Signed)
Called to room to assist during endoscopic procedure.  Patient ID and intended procedure confirmed with present staff. Received instructions for my participation in the procedure from the performing physician.  

## 2015-01-17 NOTE — Progress Notes (Signed)
Procedure ends, to recovery, report given and VSS. 

## 2015-01-17 NOTE — Op Note (Signed)
[place]  [address] [place], [id number]   COLONOSCOPY PROCEDURE REPORT  PATIENT: Mackenzie, Nichols  MR#: [id number] BIRTHDATE: [birth year] , 58  yrs. old GENDER: female ENDOSCOPIST: Ladene Artist, MD, [facility] REFERRED BY: Kennith Maes, DO PROCEDURE DATE:  01/17/2015 PROCEDURE:   Colonoscopy, diagnostic and Hemorrhoidectomy via sclerosing First Screening Colonoscopy - Avg.  risk and is 50 yrs.  old or older - No.  Prior Negative Screening - Now for repeat screening. [name]  History of Adenoma - Now for follow-up colonoscopy & has been > or = to 3 yrs.  [name]  Polyps Removed Today? No.  Polyps Removed Today? No.  Recommend repeat exam, <10 yrs? Polyps Removed Today? No.  Recommend repeat exam, <10 yrs? No. ASA CLASS:   Class II INDICATIONS:hematochezia. MEDICATIONS: Monitored anesthesia care and Propofol 240 mg IV DESCRIPTION OF PROCEDURE:   After the risks benefits and alternatives of the procedure were thoroughly explained, informed consent was obtained.  The digital rectal exam revealed external hemorrhoids.   The [id number]  endoscope was introduced through the anus and advanced to the cecum, which was identified by both the appendix and ileocecal valve. No adverse events experienced.   The quality of the prep was excellent, using MoviPrep  The instrument was then slowly withdrawn as the colon was fully examined.    COLON FINDINGS: A normal appearing cecum, ileocecal valve, and appendiceal orifice were identified.  The ascending, transverse, descending, sigmoid colon, and rectum appeared unremarkable. Retroflexed views revealed internal Grade I hemorrhoids. 3.5 cc of 23.4% saline was injected into the internal hemorrhoids well above the dentate line with a good initial result. The time to cecum=3 minutes 11 seconds.  Withdrawal time=10 minutes 32 seconds.  The scope was withdrawn and the procedure completed. COMPLICATIONS: There were no immediate  complications.  ENDOSCOPIC IMPRESSION: 1.  Normal colonoscopy 2.  Internal and external hemorrhoids  RECOMMENDATIONS: 1.  High fiber diet with liberal fluid intake. 2.  Continue to follow colorectal cancer screening guidelines for "routine risk" patients with a repeat colonoscopy in 10 years. There is no need for routine, screening FOBT (stool) testing for at least 5 years.  eSigned:  Ladene Artist, MD, [facility] 01/17/2015 3:07 PM

## 2015-01-17 NOTE — Patient Instructions (Signed)
YOU HAD AN ENDOSCOPIC PROCEDURE TODAY AT [facility]: Refer to the procedure report that was given to you for any specific questions about what was found during the examination.  If the procedure report does not answer your questions, please call your gastroenterologist to clarify.  If you requested that your care partner not be given the details of your procedure findings, then the procedure report has been included in a sealed envelope for you to review at your convenience later.  YOU SHOULD EXPECT: Some feelings of bloating in the abdomen. Passage of more gas than usual.  Walking can help get rid of the air that was put into your GI tract during the procedure and reduce the bloating. If you had a lower endoscopy (such as a colonoscopy or flexible sigmoidoscopy) you may notice spotting of blood in your stool or on the toilet paper. If you underwent a bowel prep for your procedure, then you may not have a normal bowel movement for a few days.  DIET: Your first meal following the procedure should be a light meal and then it is ok to progress to your normal diet.  A half-sandwich or bowl of soup is an example of a good first meal.  Heavy or fried foods are harder to digest and may make you feel nauseous or bloated.  Likewise meals heavy in dairy and vegetables can cause extra gas to form and this can also increase the bloating.  Drink plenty of fluids but you should avoid alcoholic beverages for 24 hours.Increase the fiber in your diet due to the hemorrhoids.  ACTIVITY: Your care partner should take you home directly after the procedure.  You should plan to take it easy, moving slowly for the rest of the day.  You can resume normal activity the day after the procedure however you should NOT DRIVE or use heavy machinery for 24 hours (because of the sedation medicines used during the test).    SYMPTOMS TO REPORT IMMEDIATELY: A gastroenterologist can be reached at any hour.  During normal  business hours, 8:30 AM to 5:00 PM Monday through Friday, call [phone].  After hours and on weekends, please call the GI answering service at [phone] who will take a message and have the physician on call contact you.   Following lower endoscopy (colonoscopy or flexible sigmoidoscopy):  Excessive amounts of blood in the stool  Significant tenderness or worsening of abdominal pains  Swelling of the abdomen that is new, acute  Fever of 100F or higher  FOLLOW UP: If any biopsies were taken you will be contacted by phone or by letter within the next 1-3 weeks.  Call your gastroenterologist if you have not heard about the biopsies in 3 weeks.  Our staff will call the home number listed on your records the next business day following your procedure to check on you and address any questions or concerns that you may have at that time regarding the information given to you following your procedure. This is a courtesy call and so if there is no answer at the home number and we have not heard from you through the emergency physician on call, we will assume that you have returned to your regular daily activities without incident.  SIGNATURES/CONFIDENTIALITY: You and/or your care partner have signed paperwork which will be entered into your electronic medical record.  These signatures attest to the fact that that the information above on your After Visit Summary has been reviewed and is  understood.  Full responsibility of the confidentiality of this discharge information lies with you and/or your care-partner.  If your hems still bother you in a couple of days, call the office and we can call in some annusol suppositories for relief. Read all of the handouts given to you by your recovery room nurse.

## 2015-01-18 ENCOUNTER — Telehealth: Payer: Self-pay

## 2015-01-18 NOTE — Telephone Encounter (Signed)
  Follow up Call-  Call back number 01/17/2015  Post procedure Call Back phone  # [phone]  Permission to leave phone message Yes     Patient questions:  Do you have a fever, pain , or abdominal swelling? No. Pain Score  0 *  Have you tolerated food without any problems? Yes.    Have you been able to return to your normal activities? Yes.    Do you have any questions about your discharge instructions: Diet   No. Medications  No. Follow up visit  No.  Do you have questions or concerns about your Care? No.  Actions: * If pain score is 4 or above: No action needed, pain <4.

## 2015-04-12 ENCOUNTER — Emergency Department (HOSPITAL_COMMUNITY)
Admission: EM | Admit: 2015-04-12 | Discharge: 2015-04-12 | Disposition: A | Discharge status: Home / Self Care | Payer: Managed Care, Other (non HMO) | Attending: Emergency Medicine | Admitting: Emergency Medicine

## 2015-04-12 ENCOUNTER — Encounter (HOSPITAL_COMMUNITY): Payer: Self-pay | Admitting: *Deleted

## 2015-04-12 DIAGNOSIS — E119 Type 2 diabetes mellitus without complications: Secondary | ICD-10-CM | POA: Insufficient documentation

## 2015-04-12 DIAGNOSIS — J069 Acute upper respiratory infection, unspecified: Secondary | ICD-10-CM

## 2015-04-12 DIAGNOSIS — Z8742 Personal history of other diseases of the female genital tract: Secondary | ICD-10-CM | POA: Insufficient documentation

## 2015-04-12 DIAGNOSIS — E669 Obesity, unspecified: Secondary | ICD-10-CM | POA: Insufficient documentation

## 2015-04-12 DIAGNOSIS — I10 Essential (primary) hypertension: Secondary | ICD-10-CM | POA: Insufficient documentation

## 2015-04-12 DIAGNOSIS — Z79899 Other long term (current) drug therapy: Secondary | ICD-10-CM | POA: Insufficient documentation

## 2015-04-12 MED ORDER — ALBUTEROL SULFATE HFA 108 (90 BASE) MCG/ACT IN AERS: 1.0000 | INHALATION_SPRAY | Freq: Four times a day (QID) | RESPIRATORY_TRACT | Status: DC | PRN

## 2015-04-12 MED ORDER — HYDROCOD POLST-CPM POLST ER 10-8 MG/5ML PO SUER: 5.0000 mL | Freq: Two times a day (BID) | ORAL | Status: DC | PRN

## 2015-04-12 NOTE — Discharge Instructions (Signed)
Read the information below.  Use the prescribed medication as directed.  Please discuss all new medications with your pharmacist.  You may return to the Emergency Department at any time for worsening condition or any new symptoms that concern you.    If you develop high fevers that do not resolve with tylenol or ibuprofen, you have difficulty swallowing or breathing, or you are unable to tolerate fluids by mouth, return to the ER for a recheck.      Upper Respiratory Infection, Adult An upper respiratory infection (URI) is also sometimes known as the common cold. The upper respiratory tract includes the nose, sinuses, throat, trachea, and bronchi. Bronchi are the airways leading to the lungs. Most people improve within 1 week, but symptoms can last up to 2 weeks. A residual cough may last even longer.  CAUSES Many different viruses can infect the tissues lining the upper respiratory tract. The tissues become irritated and inflamed and often become very moist. Mucus production is also common. A cold is contagious. You can easily spread the virus to others by oral contact. This includes kissing, sharing a glass, coughing, or sneezing. Touching your mouth or nose and then touching a surface, which is then touched by another person, can also spread the virus. SYMPTOMS  Symptoms typically develop 1 to 3 days after you come in contact with a cold virus. Symptoms vary from person to person. They may include:  Runny nose.  Sneezing.  Nasal congestion.  Sinus irritation.  Sore throat.  Loss of voice (laryngitis).  Cough.  Fatigue.  Muscle aches.  Loss of appetite.  Headache.  Low-grade fever. DIAGNOSIS  You might diagnose your own cold based on familiar symptoms, since most people get a cold 2 to 3 times a year. Your caregiver can confirm this based on your exam. Most importantly, your caregiver can check that your symptoms are not due to another disease such as strep throat, sinusitis,  pneumonia, asthma, or epiglottitis. Blood tests, throat tests, and X-rays are not necessary to diagnose a common cold, but they may sometimes be helpful in excluding other more serious diseases. Your caregiver will decide if any further tests are required. RISKS AND COMPLICATIONS  You may be at risk for a more severe case of the common cold if you smoke cigarettes, have chronic heart disease (such as heart failure) or lung disease (such as asthma), or if you have a weakened immune system. The very young and very old are also at risk for more serious infections. Bacterial sinusitis, middle ear infections, and bacterial pneumonia can complicate the common cold. The common cold can worsen asthma and chronic obstructive pulmonary disease (COPD). Sometimes, these complications can require emergency medical care and may be life-threatening. PREVENTION  The best way to protect against getting a cold is to practice good hygiene. Avoid oral or hand contact with people with cold symptoms. Wash your hands often if contact occurs. There is no clear evidence that vitamin C, vitamin E, echinacea, or exercise reduces the chance of developing a cold. However, it is always recommended to get plenty of rest and practice good nutrition. TREATMENT  Treatment is directed at relieving symptoms. There is no cure. Antibiotics are not effective, because the infection is caused by a virus, not by bacteria. Treatment may include:  Increased fluid intake. Sports drinks offer valuable electrolytes, sugars, and fluids.  Breathing heated mist or steam (vaporizer or shower).  Eating chicken soup or other clear broths, and maintaining good nutrition.  Getting plenty of rest.  Using gargles or lozenges for comfort.  Controlling fevers with ibuprofen or acetaminophen as directed by your caregiver.  Increasing usage of your inhaler if you have asthma. Zinc gel and zinc lozenges, taken in the first 24 hours of the common cold, can  shorten the duration and lessen the severity of symptoms. Pain medicines may help with fever, muscle aches, and throat pain. A variety of non-prescription medicines are available to treat congestion and runny nose. Your caregiver can make recommendations and may suggest nasal or lung inhalers for other symptoms.  HOME CARE INSTRUCTIONS   Only take over-the-counter or prescription medicines for pain, discomfort, or fever as directed by your caregiver.  Use a warm mist humidifier or inhale steam from a shower to increase air moisture. This may keep secretions moist and make it easier to breathe.  Drink enough water and fluids to keep your urine clear or pale yellow.  Rest as needed.  Return to work when your temperature has returned to normal or as your caregiver advises. You may need to stay home longer to avoid infecting others. You can also use a face mask and careful hand washing to prevent spread of the virus. SEEK MEDICAL CARE IF:   After the first few days, you feel you are getting worse rather than better.  You need your caregiver's advice about medicines to control symptoms.  You develop chills, worsening shortness of breath, or brown or red sputum. These may be signs of pneumonia.  You develop yellow or brown nasal discharge or pain in the face, especially when you bend forward. These may be signs of sinusitis.  You develop a fever, swollen neck glands, pain with swallowing, or white areas in the back of your throat. These may be signs of strep throat. SEEK IMMEDIATE MEDICAL CARE IF:   You have a fever.  You develop severe or persistent headache, ear pain, sinus pain, or chest pain.  You develop wheezing, a prolonged cough, cough up blood, or have a change in your usual mucus (if you have chronic lung disease).  You develop sore muscles or a stiff neck. Document Released: 05/28/2001 Document Revised: 02/24/2012 Document Reviewed: 03/09/2014 [facility] Patient Information 2015  [place], [place]. This information is not intended to replace advice given to you by your health care provider. Make sure you discuss any questions you have with your health care provider.  Viral Infections A virus is a type of germ. Viruses can cause:  Minor sore throats.  Aches and pains.  Headaches.  Runny nose.  Rashes.  Watery eyes.  Tiredness.  Coughs.  Loss of appetite.  Feeling sick to your stomach (nausea).  Throwing up (vomiting).  Watery poop (diarrhea). HOME CARE   Only take medicines as told by your doctor.  Drink enough water and fluids to keep your pee (urine) clear or pale yellow. Sports drinks are a good choice.  Get plenty of rest and eat healthy. Soups and broths with crackers or rice are fine. GET HELP RIGHT AWAY IF:   You have a very bad headache.  You have shortness of breath.  You have chest pain or neck pain.  You have an unusual rash.  You cannot stop throwing up.  You have watery poop that does not stop.  You cannot keep fluids down.  You or your child has a temperature by mouth above 102 F (38.9 C), not controlled by medicine.  Your baby is older than 3 months with a  rectal temperature of 102 F (38.9 C) or higher.  Your baby is 62 months old or younger with a rectal temperature of 100.4 F (38 C) or higher. MAKE SURE YOU:   Understand these instructions.  Will watch this condition.  Will get help right away if you are not doing well or get worse. Document Released: 11/14/2008 Document Revised: 02/24/2012 Document Reviewed: 04/09/2011 [facility] Patient Information 2015 [place], [place]. This information is not intended to replace advice given to you by your health care provider. Make sure you discuss any questions you have with your health care provider.

## 2015-04-12 NOTE — ED Provider Notes (Signed)
CSN: [id number]     Arrival date & time 04/12/15  1147 History   First MD Initiated Contact with Patient 04/12/15 1219     Chief Complaint  Patient presents with  . Cough   Patient is a 59 y.o. female presenting with cough. The history is provided by the patient. No language interpreter was used.  Cough Associated symptoms: headaches, myalgias, rhinorrhea and sore throat   Associated symptoms: no chest pain, no chills, no fever, no rash and no shortness of breath    This chart was scribed for non-physician practitioner Antonietta Breach, PA-C, working with Noemi Chapel, MD, by Thea Alken, ED Scribe. This patient was seen in room WTR6/WTR6 and the patient's care was started at 12:29 PM.  PARRIS SIGNER is a 59 y.o. female who presents to the Emergency Department complaining of a cough that began 5 days ago. Pt states symptoms began with a sore throat followed by a cough, light headedness, body aches, occasional mild Ha and rhinorrhea. She has tried theraflu and alka seltzer plus without relief to symptoms. Pt reports missing multiple days at work due to persistent symptoms. Pt has had sick contacts at work. She denies CP, SOB. Pt denies receiving flu shot this year.   Past Medical History  Diagnosis Date  . Hypertension   . Diabetes mellitus, type 2   . Hyperlipidemia   . Obesity   . Abnormal uterine bleeding    Past Surgical History  Procedure Laterality Date  . Hernia repair    . Wisdom tooth extraction     Family History  Problem Relation Age of Onset  . Diabetes Mother   . Diabetes Father   . Diabetes Sister   . Heart disease Father   . Colon cancer Neg Hx   . Colon polyps Neg Hx   . Kidney disease Neg Hx   . Esophageal cancer Neg Hx   . Gallbladder disease Neg Hx    History  Substance Use Topics  . Smoking status: Never Smoker   . Smokeless tobacco: Never Used  . Alcohol Use: No   OB History    Gravida Para Term Preterm AB TAB SAB Ectopic Multiple Living   7 6 6  1  1    6      Review of Systems  Constitutional: Negative for fever and chills.  HENT: Positive for rhinorrhea and sore throat.   Respiratory: Positive for cough. Negative for shortness of breath.   Cardiovascular: Negative for chest pain.  Musculoskeletal: Positive for myalgias.  Skin: Negative for rash.  Allergic/Immunologic: Positive for immunocompromised state (diabetic).  Neurological: Positive for light-headedness and headaches.  Hematological: Does not bruise/bleed easily.  Psychiatric/Behavioral: Negative for self-injury.      Allergies  Clindamycin/lincomycin  Home Medications   Prior to Admission medications   Medication Sig Start Date End Date Taking? Authorizing Provider  amLODipine (NORVASC) 5 MG tablet TABLET 1 TABLET BY MOUTH DAILY 11/28/14   Bryan R Hess, DO  CINNAMON PO Take by mouth.    Historical Provider, MD  meloxicam (MOBIC) 15 MG tablet Take 1 tablet (15 mg total) by mouth daily. Take 1 pill daily X 14 days then as needed Patient not taking: Reported on 01/17/2015 11/02/14   Gerda Diss, DO  Multiple Vitamin (MULTIVITAMIN WITH MINERALS) TABS tablet Take 1 tablet by mouth daily.    Historical Provider, MD  naproxen (NAPROSYN) 500 MG tablet Take 1 tablet (500 mg total) by mouth 2 (two) times daily as  needed. Patient not taking: Reported on 01/17/2015 12/21/14   Gerda Diss, DO  traMADol (ULTRAM) 50 MG tablet Take 1 tablet (50 mg total) by mouth every 6 (six) hours as needed. 07/29/14   Hyman Bible, PA-C  triamcinolone cream (KENALOG) 0.1 %  10/02/14   Historical Provider, MD   BP 119/64 mmHg  Pulse 82  Temp(Src) 98.8 F (37.1 C) (Oral)  Resp 18  Wt 235 lb (106.595 kg)  SpO2 100%  LMP 06/15/2013 Physical Exam  Constitutional: She appears well-developed and well-nourished. No distress.  HENT:  Head: Normocephalic and atraumatic.  Mouth/Throat: Posterior oropharyngeal erythema present. No oropharyngeal exudate.  Eyes: Conjunctivae are normal.  Neck:  Neck supple.  Cardiovascular: Normal rate, regular rhythm, normal heart sounds and intact distal pulses.  Exam reveals no gallop and no friction rub.   No murmur heard. Pulmonary/Chest: Effort normal and breath sounds normal. No respiratory distress. She has no wheezes. She has no rales.  Lymphadenopathy:    She has no cervical adenopathy.  Neurological: She is alert.  Skin: She is not diaphoretic.  Psychiatric: She has a normal mood and affect. Her behavior is normal.  Nursing note and vitals reviewed.   ED Course  Procedures (including critical care time) DIAGNOSTIC STUDIES: Oxygen Saturation is 100% on RA, normal by my interpretation.    COORDINATION OF CARE: 12:36 PM- Pt advised of plan for treatment and pt agrees.  Labs Review Labs Reviewed - No data to display  Imaging Review No results found.   EKG Interpretation None      MDM   Final diagnoses:  URI (upper respiratory infection)    Afebrile, nontoxic patient with constellation of symptoms suggestive of viral syndrome.  No concerning findings on exam.  Discharged home with supportive care, PCP follow up.  Discussed  findings, treatment, and follow up  with patient.  Pt given return precautions.  Pt verbalizes understanding and agrees with plan.      I personally performed the services described in this documentation, which was scribed in my presence. The recorded information has been reviewed and is accurate.    Clayton Bibles, PA-C 04/12/15 [place], MD 04/13/15 [phone]

## 2015-04-12 NOTE — ED Notes (Signed)
Symptoms since last Friday; feels like sinus infection; started as sore throat-developed into cough/dizziness/unaware of fever; sweats; mask in place in triage

## 2015-08-31 ENCOUNTER — Encounter (HOSPITAL_COMMUNITY): Payer: Self-pay | Admitting: Emergency Medicine

## 2015-08-31 ENCOUNTER — Emergency Department (INDEPENDENT_AMBULATORY_CARE_PROVIDER_SITE_OTHER)
Admission: EM | Admit: 2015-08-31 | Discharge: 2015-08-31 | Disposition: A | Discharge status: Home / Self Care | Payer: BLUE CROSS/BLUE SHIELD | Source: Home / Self Care | Attending: Family Medicine | Admitting: Family Medicine

## 2015-08-31 DIAGNOSIS — R69 Illness, unspecified: Secondary | ICD-10-CM | POA: Diagnosis not present

## 2015-08-31 DIAGNOSIS — E119 Type 2 diabetes mellitus without complications: Secondary | ICD-10-CM | POA: Diagnosis not present

## 2015-08-31 DIAGNOSIS — I10 Essential (primary) hypertension: Secondary | ICD-10-CM | POA: Diagnosis not present

## 2015-08-31 DIAGNOSIS — R6889 Other general symptoms and signs: Secondary | ICD-10-CM

## 2015-08-31 LAB — POCT I-STAT, CHEM 8
BUN: 8 mg/dL (ref 6–20)
CREATININE: 0.8 mg/dL (ref 0.44–1.00)
Calcium, Ion: 1.22 mmol/L (ref 1.12–1.23)
Chloride: 103 mmol/L (ref 101–111)
Glucose, Bld: 113 mg/dL — ABNORMAL HIGH (ref 65–99)
HCT: 47 % — ABNORMAL HIGH (ref 36.0–46.0)
Hemoglobin: 16 g/dL — ABNORMAL HIGH (ref 12.0–15.0)
Potassium: 4 mmol/L (ref 3.5–5.1)
Sodium: 140 mmol/L (ref 135–145)
TCO2: 27 mmol/L (ref 0–100)

## 2015-08-31 NOTE — Discharge Instructions (Signed)
Hypertension °Hypertension, commonly called high blood pressure, is when the force of blood pumping through your arteries is too strong. Your arteries are the blood vessels that carry blood from your heart throughout your body. A blood pressure reading consists of a higher number over a lower number, such as 110/72. The higher number (systolic) is the pressure inside your arteries when your heart pumps. The lower number (diastolic) is the pressure inside your arteries when your heart relaxes. Ideally you want your blood pressure below 120/80. °Hypertension forces your heart to work harder to pump blood. Your arteries may become narrow or stiff. Having hypertension puts you at risk for heart disease, stroke, and other problems.  °RISK FACTORS °Some risk factors for high blood pressure are controllable. Others are not.  °Risk factors you cannot control include:  °· Race. You may be at higher risk if you are African American. °· Age. Risk increases with age. °· Gender. Men are at higher risk than women before age 45 years. After age 65, women are at higher risk than men. °Risk factors you can control include: °· Not getting enough exercise or physical activity. °· Being overweight. °· Getting too much fat, sugar, calories, or salt in your diet. °· Drinking too much alcohol. °SIGNS AND SYMPTOMS °Hypertension does not usually cause signs or symptoms. Extremely high blood pressure (hypertensive crisis) may cause headache, anxiety, shortness of breath, and nosebleed. °DIAGNOSIS  °To check if you have hypertension, your health care provider will measure your blood pressure while you are seated, with your arm held at the level of your heart. It should be measured at least twice using the same arm. Certain conditions can cause a difference in blood pressure between your right and left arms. A blood pressure reading that is higher than normal on one occasion does not mean that you need treatment. If one blood pressure reading  is high, ask your health care provider about having it checked again. °TREATMENT  °Treating high blood pressure includes making lifestyle changes and possibly taking medicine. Living a healthy lifestyle can help lower high blood pressure. You may need to change some of your habits. °Lifestyle changes may include: °· Following the DASH diet. This diet is high in fruits, vegetables, and whole grains. It is low in salt, red meat, and added sugars. °· Getting at least 2½ hours of brisk physical activity every week. °· Losing weight if necessary. °· Not smoking. °· Limiting alcoholic beverages. °· Learning ways to reduce stress. ° If lifestyle changes are not enough to get your blood pressure under control, your health care provider may prescribe medicine. You may need to take more than one. Work closely with your health care provider to understand the risks and benefits. °HOME CARE INSTRUCTIONS °· Have your blood pressure rechecked as directed by your health care provider.   °· Take medicines only as directed by your health care provider. Follow the directions carefully. Blood pressure medicines must be taken as prescribed. The medicine does not work as well when you skip doses. Skipping doses also puts you at risk for problems.   °· Do not smoke.   °· Monitor your blood pressure at home as directed by your health care provider.  °SEEK MEDICAL CARE IF:  °· You think you are having a reaction to medicines taken. °· You have recurrent headaches or feel dizzy. °· You have swelling in your ankles. °· You have trouble with your vision. °SEEK IMMEDIATE MEDICAL CARE IF: °· You develop a severe headache or confusion. °·   You have unusual weakness, numbness, or feel faint.  You have severe chest or abdominal pain.  You vomit repeatedly.  You have trouble breathing. MAKE SURE YOU:   Understand these instructions.  Will watch your condition.  Will get help right away if you are not doing well or get worse. Document  Released: 12/02/2005 Document Revised: 04/18/2014 Document Reviewed: 09/24/2013 [facility] Patient Information 2015 [place], [place]. This information is not intended to replace advice given to you by your health care provider. Make sure you discuss any questions you have with your health care provider.  Type 2 Diabetes Mellitus Type 2 diabetes mellitus, often simply referred to as type 2 diabetes, is a long-lasting (chronic) disease. In type 2 diabetes, the pancreas does not make enough insulin (a hormone), the cells are less responsive to the insulin that is made (insulin resistance), or both. Normally, insulin moves sugars from food into the tissue cells. The tissue cells use the sugars for energy. The lack of insulin or the lack of normal response to insulin causes excess sugars to build up in the blood instead of going into the tissue cells. As a result, high blood sugar (hyperglycemia) develops. The effect of high sugar (glucose) levels can cause many complications. Type 2 diabetes was also previously called adult-onset diabetes, but it can occur at any age.  RISK FACTORS  A person is predisposed to developing type 2 diabetes if someone in the family has the disease and also has one or more of the following primary risk factors:  Overweight.  An inactive lifestyle.  A history of consistently eating high-calorie foods. Maintaining a normal weight and regular physical activity can reduce the chance of developing type 2 diabetes. SYMPTOMS  A person with type 2 diabetes may not show symptoms initially. The symptoms of type 2 diabetes appear slowly. The symptoms include:  Increased thirst (polydipsia).  Increased urination (polyuria).  Increased urination during the night (nocturia).  Weight loss. This weight loss may be rapid.  Frequent, recurring infections.  Tiredness (fatigue).  Weakness.  Vision changes, such as blurred vision.  Fruity smell to your breath.  Abdominal  pain.  Nausea or vomiting.  Cuts or bruises which are slow to heal.  Tingling or numbness in the hands or feet. DIAGNOSIS Type 2 diabetes is frequently not diagnosed until complications of diabetes are present. Type 2 diabetes is diagnosed when symptoms or complications are present and when blood glucose levels are increased. Your blood glucose level may be checked by one or more of the following blood tests:  A fasting blood glucose test. You will not be allowed to eat for at least 8 hours before a blood sample is taken.  A random blood glucose test. Your blood glucose is checked at any time of the day regardless of when you ate.  A hemoglobin A1c blood glucose test. A hemoglobin A1c test provides information about blood glucose control over the previous 3 months.  An oral glucose tolerance test (OGTT). Your blood glucose is measured after you have not eaten (fasted) for 2 hours and then after you drink a glucose-containing beverage. TREATMENT   You may need to take insulin or diabetes medicine daily to keep blood glucose levels in the desired range.  If you use insulin, you may need to adjust the dosage depending on the carbohydrates that you eat with each meal or snack. The treatment goal is to maintain the before meal blood sugar (preprandial glucose) level at 70-130 mg/dL. HOME CARE INSTRUCTIONS  Have your hemoglobin A1c level checked twice a year.  Perform daily blood glucose monitoring as directed by your health care provider.  Monitor urine ketones when you are ill and as directed by your health care provider.  Take your diabetes medicine or insulin as directed by your health care provider to maintain your blood glucose levels in the desired range.  Never run out of diabetes medicine or insulin. It is needed every day.  If you are using insulin, you may need to adjust the amount of insulin given based on your intake of carbohydrates. Carbohydrates can raise blood glucose  levels but need to be included in your diet. Carbohydrates provide vitamins, minerals, and fiber which are an essential part of a healthy diet. Carbohydrates are found in fruits, vegetables, whole grains, dairy products, legumes, and foods containing added sugars.  Eat healthy foods. You should make an appointment to see a registered dietitian to help you create an eating plan that is right for you.  Lose weight if you are overweight.  Carry a medical alert card or wear your medical alert jewelry.  Carry a 15-gram carbohydrate snack with you at all times to treat low blood glucose (hypoglycemia). Some examples of 15-gram carbohydrate snacks include:  Glucose tablets, 3 or 4.  Glucose gel, 15-gram tube.  Raisins, 2 tablespoons (24 grams).  Jelly beans, 6.  Animal crackers, 8.  Regular pop, 4 ounces (120 mL).  Gummy treats, 9.  Recognize hypoglycemia. Hypoglycemia occurs with blood glucose levels of 70 mg/dL and below. The risk for hypoglycemia increases when fasting or skipping meals, during or after intense exercise, and during sleep. Hypoglycemia symptoms can include:  Tremors or shakes.  Decreased ability to concentrate.  Sweating.  Increased heart rate.  Headache.  Dry mouth.  Hunger.  Irritability.  Anxiety.  Restless sleep.  Altered speech or coordination.  Confusion.  Treat hypoglycemia promptly. If you are alert and able to safely swallow, follow the 15:15 rule:  Take 15-20 grams of rapid-acting glucose or carbohydrate. Rapid-acting options include glucose gel, glucose tablets, or 4 ounces (120 mL) of fruit juice, regular soda, or low-fat milk.  Check your blood glucose level 15 minutes after taking the glucose.  Take 15-20 grams more of glucose if the repeat blood glucose level is still 70 mg/dL or below.  Eat a meal or snack within 1 hour once blood glucose levels return to normal.  Be alert to feeling very thirsty and urinating more frequently  than usual, which are early signs of hyperglycemia. An early awareness of hyperglycemia allows for prompt treatment. Treat hyperglycemia as directed by your health care provider.  Engage in at least 150 minutes of moderate-intensity physical activity a week, spread over at least 3 days of the week or as directed by your health care provider. In addition, you should engage in resistance exercise at least 2 times a week or as directed by your health care provider. Try to spend no more than 90 minutes at one time inactive.  Adjust your medicine and food intake as needed if you start a new exercise or sport.  Follow your sick-day plan anytime you are unable to eat or drink as usual.  Do not use any tobacco products including cigarettes, chewing tobacco, or electronic cigarettes. If you need help quitting, ask your health care provider.  Limit alcohol intake to no more than 1 drink per day for nonpregnant women and 2 drinks per day for men. You should drink alcohol only when  you are also eating food. Talk with your health care provider whether alcohol is safe for you. Tell your health care provider if you drink alcohol several times a week.  Keep all follow-up visits as directed by your health care provider. This is important.  Schedule an eye exam soon after the diagnosis of type 2 diabetes and then annually.  Perform daily skin and foot care. Examine your skin and feet daily for cuts, bruises, redness, nail problems, bleeding, blisters, or sores. A foot exam by a health care provider should be done annually.  Brush your teeth and gums at least twice a day and floss at least once a day. Follow up with your dentist regularly.  Share your diabetes management plan with your workplace or school.  Stay up-to-date with immunizations. It is recommended that people with diabetes who are over 12 years old get the pneumonia vaccine. In some cases, two separate shots may be given. Ask your health care  provider if your pneumonia vaccination is up-to-date.  Learn to manage stress.  Obtain ongoing diabetes education and support as needed.  Participate in or seek rehabilitation as needed to maintain or improve independence and quality of life. Request a physical or occupational therapy referral if you are having foot or hand numbness, or difficulties with grooming, dressing, eating, or physical activity. SEEK MEDICAL CARE IF:   You are unable to eat food or drink fluids for more than 6 hours.  You have nausea and vomiting for more than 6 hours.  Your blood glucose level is over 240 mg/dL.  There is a change in mental status.  You develop an additional serious illness.  You have diarrhea for more than 6 hours.  You have been sick or have had a fever for a couple of days and are not getting better.  You have pain during any physical activity.  SEEK IMMEDIATE MEDICAL CARE IF:  You have difficulty breathing.  You have moderate to large ketone levels. MAKE SURE YOU:  Understand these instructions.  Will watch your condition.  Will get help right away if you are not doing well or get worse. Document Released: 12/02/2005 Document Revised: 04/18/2014 Document Reviewed: 06/30/2012 [facility] Patient Information 2015 [place], [place]. This information is not intended to replace advice given to you by your health care provider. Make sure you discuss any questions you have with your health care provider.

## 2015-08-31 NOTE — ED Provider Notes (Signed)
CSN: [id number]     Arrival date & time 08/31/15  1623 History   First MD Initiated Contact with Patient 08/31/15 1927     Chief Complaint  Patient presents with  . Hypertension   (Consider location/radiation/quality/duration/timing/severity/associated sxs/prior Treatment) HPI Comments: 59 year old obese female states that while she was at work today she started feeling bad. She cannot explain this in any other way. She has no pain. In her view of systems are essentially negative. She states that she does have a faint-like feeling which is very transient and mild and has been occurring intermittently for several months. This is not a major problem for her today. She has history of type 2 diabetes mellitus, hypertension, dyslipidemia and obesity. While at work her coworkers took her blood pressure and had a reading of 165/100. It is normal in the urgent care.   Past Medical History  Diagnosis Date  . Hypertension   . Diabetes mellitus, type 2   . Hyperlipidemia   . Obesity   . Abnormal uterine bleeding    Past Surgical History  Procedure Laterality Date  . Hernia repair    . Wisdom tooth extraction     Family History  Problem Relation Age of Onset  . Diabetes Mother   . Diabetes Father   . Diabetes Sister   . Heart disease Father   . Colon cancer Neg Hx   . Colon polyps Neg Hx   . Kidney disease Neg Hx   . Esophageal cancer Neg Hx   . Gallbladder disease Neg Hx    Social History  Substance Use Topics  . Smoking status: Never Smoker   . Smokeless tobacco: Never Used  . Alcohol Use: No   OB History    Gravida Para Term Preterm AB TAB SAB Ectopic Multiple Living   7 6 6  1  1   6      Review of Systems  Constitutional: Negative.   HENT: Negative.   Eyes: Negative.   Respiratory: Negative.   Cardiovascular: Negative.   Gastrointestinal: Negative.   Endocrine: Negative for polydipsia and polyphagia.  Musculoskeletal: Negative.   Skin: Negative.   Neurological:  Negative.   Psychiatric/Behavioral: Negative.   All other systems reviewed and are negative.   Allergies  Clindamycin/lincomycin  Home Medications   Prior to Admission medications   Medication Sig Start Date End Date Taking? Authorizing Provider  Fish Oil OIL by Does not apply route.   Yes Historical Provider, MD  albuterol (PROVENTIL HFA;VENTOLIN HFA) 108 (90 BASE) MCG/ACT inhaler Inhale 1-2 puffs into the lungs every 6 (six) hours as needed for wheezing or shortness of breath. 04/12/15   Clayton Bibles, PA-C  amLODipine (NORVASC) 5 MG tablet TABLET 1 TABLET BY MOUTH DAILY 11/28/14   Nolon Rod, DO  chlorpheniramine-HYDROcodone (TUSSIONEX PENNKINETIC ER) 10-8 MG/5ML SUER Take 5 mLs by mouth every 12 (twelve) hours as needed for cough (and pain). 04/12/15   Clayton Bibles, PA-C  CINNAMON PO Take by mouth.    Historical Provider, MD  Multiple Vitamin (MULTIVITAMIN WITH MINERALS) TABS tablet Take 1 tablet by mouth daily.    Historical Provider, MD  traMADol (ULTRAM) 50 MG tablet Take 1 tablet (50 mg total) by mouth every 6 (six) hours as needed. 07/29/14   Hyman Bible, PA-C  triamcinolone cream (KENALOG) 0.1 %  10/02/14   Historical Provider, MD   Meds Ordered and Administered this Visit  Medications - No data to display  BP 137/83 mmHg  Pulse 65  Temp(Src) 97.6 F (36.4 C) (Oral)  Resp 16  SpO2 97%  LMP 06/15/2013 No data found.   Physical Exam  Constitutional: She is oriented to person, place, and time. She appears well-developed and well-nourished. No distress.  HENT:  Head: Normocephalic and atraumatic.  Nose: Nose normal.  Mouth/Throat: Oropharynx is clear and moist. No oropharyngeal exudate.  Bilateral TMs are normal. Soft palate rises symmetrically. Tongue and uvula midline. No intraoral swelling or discoloration. Speech is clear.  Eyes: Conjunctivae and EOM are normal. Pupils are equal, round, and reactive to light.  Neck: Normal range of motion. Neck supple. No  thyromegaly present.  Cardiovascular: Normal rate, regular rhythm, normal heart sounds and intact distal pulses.   No murmur heard. Pulmonary/Chest: Effort normal and breath sounds normal. No respiratory distress. She has no wheezes. She has no rales.  Abdominal: Soft. There is no tenderness.  Musculoskeletal: Normal range of motion. She exhibits no edema or tenderness.  Lymphadenopathy:    She has no cervical adenopathy.  Neurological: She is alert and oriented to person, place, and time. She has normal strength. No cranial nerve deficit or sensory deficit. She exhibits normal muscle tone. She displays a negative Romberg sign. Coordination and gait normal.  No dysmetria. When walking heel to toe she falls out of line. Otherwise her gait is normal.  Nursing note and vitals reviewed.   ED Course  Procedures (including critical care time)  Labs Review Labs Reviewed  POCT I-STAT, CHEM 8 - Abnormal; Notable for the following:    Glucose, Bld 113 (*)    Hemoglobin 16.0 (*)    HCT 47.0 (*)    All other components within normal limits   Results for orders placed or performed during the hospital encounter of 08/31/15  I-STAT, chem 8  Result Value Ref Range   Sodium 140 135 - 145 mmol/L   Potassium 4.0 3.5 - 5.1 mmol/L   Chloride 103 101 - 111 mmol/L   BUN 8 6 - 20 mg/dL   Creatinine, Ser 0.80 0.44 - 1.00 mg/dL   Glucose, Bld 113 (H) 65 - 99 mg/dL   Calcium, Ion 1.22 1.12 - 1.23 mmol/L   TCO2 27 0 - 100 mmol/L   Hemoglobin 16.0 (H) 12.0 - 15.0 g/dL   HCT 47.0 (H) 36.0 - 46.0 %    Imaging Review No results found.   Visual Acuity Review  Right Eye Distance:   Left Eye Distance:   Bilateral Distance:    Right Eye Near:   Left Eye Near:    Bilateral Near:         MDM   1. Feeling bad   2. Type 2 diabetes mellitus without complication   3. Essential hypertension    The patient is awake alert and oriented and showing no signs of distress. Vital signs are stable and  within normal limits. She has no complaints of pain. And other than "feeling bad" there are no illness physical findings of concern. The i-STAT shows normal renal function and glucose. H&H is mildly elevated. Follow-up here PCP as needed. For any worsening new symptoms or problems may return or go to the emergency department.    Janne Napoleon, NP 08/31/15 2012

## 2015-08-31 NOTE — ED Notes (Signed)
Reports she has not felt well today.  No specific pain.  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

## 2015-09-13 ENCOUNTER — Encounter: Payer: Self-pay | Admitting: Family Medicine

## 2015-09-18 ENCOUNTER — Encounter: Payer: Self-pay | Admitting: Family Medicine

## 2015-09-18 ENCOUNTER — Ambulatory Visit (INDEPENDENT_AMBULATORY_CARE_PROVIDER_SITE_OTHER): Payer: BLUE CROSS/BLUE SHIELD | Admitting: Family Medicine

## 2015-09-18 VITALS — BP 143/83 | HR 86 | Temp 97.8°F | Wt 221.0 lb

## 2015-09-18 DIAGNOSIS — N939 Abnormal uterine and vaginal bleeding, unspecified: Secondary | ICD-10-CM | POA: Diagnosis not present

## 2015-09-18 NOTE — Patient Instructions (Signed)
Endometrial Biopsy Endometrial biopsy is a procedure in which a tissue sample is taken from inside the uterus. The tissue sample is then looked at under a microscope to see if the tissue is normal or abnormal. The endometrium is the lining of the uterus. This procedure helps determine where you are in your menstrual cycle and how hormone levels are affecting the lining of the uterus. This procedure may also be used to evaluate uterine bleeding or to diagnose endometrial cancer, tuberculosis, polyps, or inflammatory conditions.  LET YOUR HEALTH CARE Ekaterini Capitano KNOW ABOUT:  Any allergies you have.  All medicines you are taking, including vitamins, herbs, eye drops, creams, and over-the-counter medicines.  Previous problems you or members of your family have had with the use of anesthetics.  Any blood disorders you have.  Previous surgeries you have had.  Medical conditions you have.  Possibility of pregnancy. RISKS AND COMPLICATIONS Generally, this is a safe procedure. However, as with any procedure, complications can occur. Possible complications include:  Bleeding.  Pelvic infection.  Puncture of the uterine wall with the biopsy device (rare). BEFORE THE PROCEDURE   Keep a record of your menstrual cycles as directed by your health care Mikias Lanz. You may need to schedule your procedure for a specific time in your cycle.  You may want to bring a sanitary pad to wear home after the procedure.  Arrange for someone to drive you home after the procedure if you will be given a medicine to help you relax (sedative). PROCEDURE   You may be given a sedative to relax you.  You will lie on an exam table with your feet and legs supported as in a pelvic exam.  Your health care Bali Lyn will insert an instrument (speculum) into your vagina to see your cervix.  Your cervix will be cleansed with an antiseptic solution. A medicine (local anesthetic) will be used to numb the cervix.  A forceps  instrument (tenaculum) will be used to hold your cervix steady for the biopsy.  A thin, rodlike instrument (uterine sound) will be inserted through your cervix to determine the length of your uterus and the location where the biopsy sample will be removed.  A thin, flexible tube (catheter) will be inserted through your cervix and into the uterus. The catheter is used to collect the biopsy sample from your endometrial tissue.  The catheter and speculum will then be removed, and the tissue sample will be sent to a lab for examination. AFTER THE PROCEDURE  You will rest in a recovery area until you are ready to go home.  You may have mild cramping and a small amount of vaginal bleeding for a few days after the procedure. This is normal.  Make sure you find out how to get your test results. Document Released: 04/04/2005 Document Revised: 08/04/2013 Document Reviewed: 05/19/2013 [employer] Patient Information 2015 [employer]. This information is not intended to replace advice given to you by your health care Raenette Sakata. Make sure you discuss any questions you have with your health care Boone Gear.  

## 2015-09-19 ENCOUNTER — Ambulatory Visit: Payer: Self-pay | Admitting: Family Medicine

## 2015-09-21 ENCOUNTER — Encounter: Payer: Self-pay | Admitting: *Deleted

## 2015-09-21 ENCOUNTER — Ambulatory Visit (INDEPENDENT_AMBULATORY_CARE_PROVIDER_SITE_OTHER): Payer: BLUE CROSS/BLUE SHIELD | Admitting: Family Medicine

## 2015-09-21 VITALS — BP 147/74 | HR 68 | Temp 97.9°F | Ht 61.0 in | Wt 225.2 lb

## 2015-09-21 DIAGNOSIS — N950 Postmenopausal bleeding: Secondary | ICD-10-CM | POA: Insufficient documentation

## 2015-09-21 DIAGNOSIS — D281 Benign neoplasm of vagina: Secondary | ICD-10-CM | POA: Diagnosis not present

## 2015-09-21 NOTE — Patient Instructions (Signed)
We are setting up your ultrasound. I would recommend also setting up an appointment with your PCP, Dr. Lajuana Ripple, as a followup in a few weeks. I will send you a note about your pathology and your ultrasound report.  Please call me if you have any questions.

## 2015-09-21 NOTE — Assessment & Plan Note (Signed)
Endometrial bx today and PUS set up

## 2015-09-21 NOTE — Progress Notes (Addendum)
Patient ID: Mackenzie Nichols, female   DOB: [birth year], 59 y.o.   MRN: [id number] Menopause 2013 with one episode of postmenopausal vaginal bleeding in 2014. Evaluation at that time included pelvic ultrasound and endometrial biopsy.  Biopsy results 2014 :SCANT FRAGMENTS OF ATROPHIC ENDOMETRIAL EPITHELIUM IN ABUNDANT MUCUS. - FRAGMENTS OF BENIGN SQUAMOUS MUCOSA. - NO HYPERPLASIA OR MALIGNANCY IDENTIFIED.  Pelvic are sound results: 2014 Uterus: Is enlarged with a sagittal length of 12.5 cm, depth of approximately 7 cm and width of approximately 8 mm. Multiple fibroids are noted. The largest fibroids have sizes and locations as follows: In the posterior lower uterine segment measuring 6.8 x 5.1 x 5.2 cm, in the posterior upper uterine segment measuring 6.1 x 5.1 x 6.0 cm, in the fundal region measuring 4.9 x 3.3 x 3.7 cm with a partial subserosal component, in the left fundal region with a partial subserosal component measuring 4.2 x 4.4 x 4.3 cm and in the fundus with a small sub serosal component measuring 3.9 x 3.2 x 4.7 cm. More diffuse fibroid involvement is suspected.  After that workup she has had no vaginal bleeding until last week when she had several days of vaginal spotting. She's had had any pelvic pain, no fever, chills, unusual weight loss. She has questions about recurrence of this and whether not she should consider hysterectomy.  PROCEDURE NOTE: Endometrial Biopsy Patient given informed consent, signed copy in the chart.  Appropriate time out taken. . The patient was placed in the lithotomy position and the cervix brought into view with sterile speculum.  The portio of cervix was cleansed x 3 with betadine swabs.  A tenaculum was placed in the anterior lip of the cervix.  A uterine sound was used to measure the uterus. 9 cm. A pipelle was introduced  into the uterus, suction created,  and an endometrial sample was obtained. Sample scant. All equipment was removed and accounted  for.   The patient tolerated the procedure well.  Minimal spotting type bleeding from tenaculum site ; hemostasis with pressure and Monsel's solution..  Patient given post procedure instructions. I will notify her of any pathology results.  ADDITIONAL FINDINGS: The left vaginal wall was noted to have some redundant tissue. This did not look like a polyp. Tissue was entirely normal in appearance. I used the colposcope to examine the area with the microscope, no acetowhite epithelium was noted with the application of acetic acid. The green filter revealed no abnormalities in vasculature. Lugol solution revealed no abnormalities.  A/P: #1. Second episode of postmenopausal vaginal bleeding. The first was in 2014 and she had complete workup with pelvic ultrasound and endometrial biopsy which was negative. I reviewed those results and report form today. Also discussed them with the patient. Today we did endometrial biopsy and set her up for pelvic ultrasound. I will notify her of this result. She's also set up an appointment with her PCP for follow-up. #2. Pelvic ultrasound in 2014 revealed large fibroids. We briefly discussed options today. She doesn't really have any symptoms of pain or pelvic pressure at this time. She is a little worried about the recurrence of vaginal bleeding. #3. Redundant tissue in the left vaginal wall about 4 cm from the cervix. This tissue looks entirely normal. It does not really look like a vaginal polyp but more like just redundant tissue. I did colposcopic examination of that today area today looking for any signs of vaginal intraepithelial neoplasia using colposcope. Negative for acetowhite, no punctation or irregular  vascularities, normal iodine staining, (Normal vaginal wall by colposcopic exam) I do not  think we need to do any further workup of that area. I doubt it is contributing to bleeding as it is not friable at all and looks like normal healthy tissue.

## 2015-09-23 NOTE — Progress Notes (Signed)
   Subjective:   Mackenzie Nichols is a 59 y.o. female with a history of DM, HTN, fibroids here for vaginal bleeding  Patient went through menopause in 2013. Had an episode of vaginal bleeding in September 2014, endometrial biopsy at that time was negative and ultrasound revealed multiple fibroids which were thought to be the cause of her bleeding. For the past few years she has had no bleeding but a few days ago she had a small amount of bleeding. No pain, dysuria, discharge, cramping.  Review of Systems:  Per HPI. All other systems reviewed and are negative.   PMH, PSH, Medications, Allergies, and FmHx reviewed and updated in EMR.  Social History: never smoker  Objective:  BP 143/83 mmHg  Pulse 86  Temp(Src) 97.8 F (36.6 C) (Oral)  Wt 221 lb (100.245 kg)  LMP 06/15/2013  Gen:  59 y.o. female in NAD HEENT: NCAT, MMM, EOMI, PERRL, anicteric sclerae CV: RRR, no MRG, no JVD Resp: Non-labored, CTAB, no wheezes noted Abd: Soft, NTND, BS present, no guarding or organomegaly Ext: WWP, no edema MSK: Full ROM, strength intact Neuro: Alert and oriented, speech normal      Chemistry      Component Value Date/Time   NA 140 08/31/2015 1945   K 4.0 08/31/2015 1945   CL 103 08/31/2015 1945   CO2 26 07/15/2014 0922   BUN 8 08/31/2015 1945   CREATININE 0.80 08/31/2015 1945   CREATININE 0.81 07/15/2014 0922      Component Value Date/Time   CALCIUM 9.2 07/15/2014 0922   ALKPHOS 40 07/15/2014 0922   AST 16 07/15/2014 0922   ALT 14 07/15/2014 0922   BILITOT 0.5 07/15/2014 0922      Lab Results  Component Value Date   WBC 5.5 08/09/2014   HGB 16.0* 08/31/2015   HCT 47.0* 08/31/2015   MCV 84.8 08/09/2014   PLT 357 08/09/2014   Lab Results  Component Value Date   TSH 0.757 07/15/2014   Lab Results  Component Value Date   HGBA1C 7.0 07/15/2014   Assessment & Plan:     Mackenzie Nichols is a 59 y.o. female here for postmenopausal bleeding  Abnormal uterine bleeding Scant  vaginal bleeding over the past few days, this is the second episode of postmenopausal bleeding, negative work-up of first episode in 2014. Known fibroids without significant symptoms normally. Single monogamous sexual partner, not concerned about infections - send to [place] [facility] for repeat endometrial biopsy - discussed that patient may wish to be referred for hysterectomy if she keeps having bleeding to prevent future unnecessary work-ups, will hold of for now    Beverlyn Roux, MD, MPH [facility] PGY-3 09/23/2015 8:09 PM

## 2015-09-23 NOTE — Assessment & Plan Note (Signed)
Scant vaginal bleeding over the past few days, this is the second episode of postmenopausal bleeding, negative work-up of first episode in 2014. Known fibroids without significant symptoms normally. Single monogamous sexual partner, not concerned about infections - send to [place] clinic for repeat endometrial biopsy - discussed that patient may wish to be referred for hysterectomy if she keeps having bleeding to prevent future unnecessary work-ups, will hold of for now

## 2015-09-25 ENCOUNTER — Telehealth: Payer: Self-pay | Admitting: Family Medicine

## 2015-09-25 ENCOUNTER — Encounter: Payer: Self-pay | Admitting: Family Medicine

## 2015-09-25 NOTE — Telephone Encounter (Signed)
Left vm message that endometrial biopsy was NORMAL. Sending letter (it was accidentally deleted but I recovered ita nd am sending by mail) ) Dorcas Mcmurray

## 2015-09-26 ENCOUNTER — Ambulatory Visit (HOSPITAL_COMMUNITY)
Admission: RE | Admit: 2015-09-26 | Discharge: 2015-09-26 | Disposition: A | Discharge status: Home / Self Care | Payer: BLUE CROSS/BLUE SHIELD | Source: Ambulatory Visit | Attending: Family Medicine | Admitting: Family Medicine

## 2015-09-26 DIAGNOSIS — D251 Intramural leiomyoma of uterus: Secondary | ICD-10-CM | POA: Insufficient documentation

## 2015-09-26 DIAGNOSIS — N950 Postmenopausal bleeding: Secondary | ICD-10-CM | POA: Insufficient documentation

## 2015-09-26 DIAGNOSIS — D252 Subserosal leiomyoma of uterus: Secondary | ICD-10-CM | POA: Diagnosis not present

## 2015-09-29 ENCOUNTER — Telehealth: Payer: Self-pay | Admitting: Family Medicine

## 2015-09-29 DIAGNOSIS — N950 Postmenopausal bleeding: Secondary | ICD-10-CM

## 2015-09-29 NOTE — Telephone Encounter (Signed)
Spoke w her re PUS: LARGE multiple fibroids. Precludes analysis of endometrial stripe by US--endo bx was OK. Discussed options. She may continue to have spotting and it will be unclear if this needs further workup. I urged her to see OBGYN for further evaluation. She wants to consider this--not really excited about having hysterectomy. I will have her f/u w PCP. I think it is safe to follow for now as work up negative.

## 2015-09-29 NOTE — Assessment & Plan Note (Signed)
LARGE multiple fibroids. Precludes analysis of endometrial stripe by US--endo bx was OK. Discussed options via phone today. She may continue to have spotting and it will be unclear if / when this would need repeat or  further workup. I urged her to see OBGYN for further evaluation. She wants to consider this--not really excited about having hysterectomy. I will have her f/u w PCP. I think it is safe to follow for now as work up negative.  IF she has continued bleeding, I would be happy to refer her to [facility] or repeat workup. I have discussed with her by phone and answered all of her questions. I urge her to see PCP as she may have other questions in future.

## 2015-10-09 ENCOUNTER — Encounter: Payer: Self-pay | Admitting: Family Medicine

## 2015-12-30 ENCOUNTER — Other Ambulatory Visit: Payer: Self-pay | Admitting: Family Medicine

## 2016-01-01 ENCOUNTER — Other Ambulatory Visit: Payer: Self-pay | Admitting: Family Medicine

## 2016-01-01 DIAGNOSIS — I10 Essential (primary) hypertension: Secondary | ICD-10-CM

## 2016-01-01 MED ORDER — AMLODIPINE BESYLATE 5 MG PO TABS: ORAL_TABLET | ORAL | Status: DC

## 2016-01-01 NOTE — Telephone Encounter (Signed)
This is your patient, see med refill 

## 2016-01-01 NOTE — Telephone Encounter (Signed)
Refilled x1.  Patient needs office visit.  No show last 2 visits.  Please let her know no additional refills after this until she is seen.

## 2016-01-10 ENCOUNTER — Encounter: Payer: Self-pay | Admitting: Family Medicine

## 2016-01-10 ENCOUNTER — Ambulatory Visit (INDEPENDENT_AMBULATORY_CARE_PROVIDER_SITE_OTHER): Payer: BLUE CROSS/BLUE SHIELD | Admitting: Family Medicine

## 2016-01-10 ENCOUNTER — Other Ambulatory Visit (HOSPITAL_COMMUNITY)
Admission: RE | Admit: 2016-01-10 | Discharge: 2016-01-10 | Disposition: A | Discharge status: Home / Self Care | Payer: BLUE CROSS/BLUE SHIELD | Source: Ambulatory Visit | Attending: Family Medicine | Admitting: Family Medicine

## 2016-01-10 VITALS — BP 137/67 | HR 85 | Temp 98.3°F | Wt 230.9 lb

## 2016-01-10 DIAGNOSIS — Z01419 Encounter for gynecological examination (general) (routine) without abnormal findings: Secondary | ICD-10-CM | POA: Diagnosis present

## 2016-01-10 DIAGNOSIS — Z124 Encounter for screening for malignant neoplasm of cervix: Secondary | ICD-10-CM | POA: Diagnosis not present

## 2016-01-10 DIAGNOSIS — N898 Other specified noninflammatory disorders of vagina: Secondary | ICD-10-CM

## 2016-01-10 DIAGNOSIS — N9489 Other specified conditions associated with female genital organs and menstrual cycle: Secondary | ICD-10-CM

## 2016-01-10 DIAGNOSIS — Z0000 Encounter for general adult medical examination without abnormal findings: Secondary | ICD-10-CM

## 2016-01-10 DIAGNOSIS — Z1151 Encounter for screening for human papillomavirus (HPV): Secondary | ICD-10-CM | POA: Diagnosis not present

## 2016-01-10 DIAGNOSIS — I10 Essential (primary) hypertension: Secondary | ICD-10-CM | POA: Diagnosis not present

## 2016-01-10 DIAGNOSIS — E119 Type 2 diabetes mellitus without complications: Secondary | ICD-10-CM

## 2016-01-10 DIAGNOSIS — D259 Leiomyoma of uterus, unspecified: Secondary | ICD-10-CM

## 2016-01-10 LAB — POCT GLYCOSYLATED HEMOGLOBIN (HGB A1C): HEMOGLOBIN A1C: 6.9

## 2016-01-10 MED ORDER — PRENATAL VITAMINS PLUS 27-1 MG PO TABS: 1.0000 | ORAL_TABLET | Freq: Every day | ORAL | Status: DC

## 2016-01-10 NOTE — Patient Instructions (Signed)
Your A1c is consistent with the diagnosis of Diabetes.  I recommend that you start Metformin, but understand that you want to make lifestyle changes.  I would like to see you back in 1 month.  I think that you would greatly benefit from seeing a nutritionist.  Please call Dr Jenne Campus at [phone] to schedule an appointment.

## 2016-01-10 NOTE — Progress Notes (Addendum)
Mackenzie Nichols is a 60 y.o. female presents to office today for annual physical exam examination.  Concerns today include:  Bloating: Patient reports that she does not have pain but she notes that she feels very bloated after eating.  She notes that this has been happening for several months.  Denies diarrhea, melena, hematochezia, nausea, vomiting.  She associates this with dairy intake.  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 Hypertension   . Diabetes mellitus, type 2 ([place])   . Hyperlipidemia   . Obesity   . Abnormal uterine bleeding    Social History   Social History  . Marital Status: Married    Spouse Name: [name]  . Number of Children: 6  . Years of Education: [name]   Occupational History  . CNA    Social History Main Topics  . Smoking status: Never Smoker   . Smokeless tobacco: Never Used  . Alcohol Use: No  . Drug Use: No  . Sexual Activity: Not on file   Other Topics Concern  . Not on file   Social History Narrative   Past Surgical History  Procedure Laterality Date  . Hernia repair    . Wisdom tooth extraction     Family History  Problem Relation Age of Onset  . Diabetes Mother   . Hypertension Mother   . Diabetes Father   . Heart disease Father   . Prostate cancer Father   . Hypertension Father   . Diabetes Sister   . Hypertension Sister   . Colon cancer Neg Hx   . Colon polyps Neg Hx   . Kidney disease Neg Hx   . Esophageal cancer Neg Hx   . Gallbladder disease Neg Hx     ROS: Review of Systems Constitutional: negative Eyes: negative Ears, nose, mouth, throat, and face: negative Respiratory: negative Cardiovascular: negative Gastrointestinal:  negative Genitourinary:negative Integument/breast: negative Hematologic/lymphatic: negative Musculoskeletal:negative Neurological: negative Behavioral/Psych: negative Endocrine: negative Allergic/Immunologic: negative   Physical exam BP 137/67 mmHg  Pulse 85  Temp(Src) 98.3 F (36.8 C) (Oral)  Wt 230 lb 14.4 oz (104.736 kg)  LMP 06/15/2013 General appearance: alert, cooperative, appears stated age, no distress and mildly obese Head: Normocephalic, without obvious abnormality, atraumatic Eyes: negative findings: lids and lashes normal, conjunctivae and sclerae normal, corneas clear, pupils equal, round, reactive to light and accomodation and visual fields full to confrontation Ears: normal TM's and external ear canals both ears Nose: Nares normal. Septum midline. Mucosa normal. No drainage or sinus tenderness. Throat: lips, mucosa, and tongue normal; teeth and gums normal Neck: no adenopathy, supple, symmetrical, trachea midline and thyroid not enlarged, symmetric, no tenderness/mass/nodules Back: symmetric, no curvature. ROM normal. No CVA tenderness. Lungs: clear to auscultation bilaterally Breasts: not examined Heart: regular rate and rhythm, S1, S2 normal, no murmur, click, rub or gallop Abdomen: soft, non-tender; bowel sounds normal; no masses,  no organomegaly Pelvic: cervix normal in appearance, external genitalia normal, no adnexal masses or tenderness, no cervical motion tenderness, rectovaginal septum normal, vagina normal without discharge and large palpable mass particularly on L side of uterus (patient has h/o fibroids), ~0.25cm flesh colored irregularly shaped mass on L side of vaginal wall Extremities: extremities normal, atraumatic, no cyanosis or edema Pulses: 2+ and symmetric Skin: Skin  color, texture, turgor normal. No rashes or lesions Lymph nodes: Cervical, supraclavicular, and axillary nodes normal. Neurologic: Alert and oriented X 3, normal strength and tone.  Normal symmetric reflexes. Normal coordination and gait    Assessment/ Plan: Patient here for annual physical exam.   1. Annual physical exam - Tdap updated - Flu and pneumonia vacc declined - Pap updated - Patient to get Mammo - Reviewed Mhx, FamHx, SurgHx.  2. Type 2 diabetes mellitus without complication, unspecified long term insulin use status ([place]). A1c 6.9.  Patient was unaware that she had DM2.  We discussed initiating Metformin.  She is adamant about Lifestyle changes.  Goal at least 5lb weight loss by next visit.  - Patient to decrease intake of carbs, increase exercise.  If no improvement in 3 months will need to start Metformin.  Patient seems amenable to this. - Foot exam performed.  Normal. - HgB A1c - COMPLETE METABOLIC PANEL WITH GFR; Future - Lipid panel; Future  3. Screening for cervical cancer - Cytology - PAP w/ HPV co-testing  4. Essential hypertension, benign, controlled. - COMPLETE METABOLIC PANEL WITH GFR; Future - Lipid panel; Future  5. Vaginal mass.  Previously seen on pelvic exam in 09/2015.  Discussed with provider.  It appears that mass has grown in size since her exam. - refer to GYN for further evaluation/ removal/ biopsy  6. Uterine leiomyoma, unspecified location.  No pain.  No vaginal bleeding.  Feels full/ heavy. 09/2015 TVUS reviewed.  Multiple fibroids visualized.  Largest is an intramural fibroid that is 5.7x6.5x6.3cm in posterior uterine body - Patient interested in removal - Referral to GYN to discuss this.  Follow up in 1 month to discuss how lifestyle changes are going.  Likely will need to start Metformin.   Mackenzie M. Lajuana Ripple, DO PGY-2, [place]

## 2016-01-11 ENCOUNTER — Encounter: Payer: Self-pay | Admitting: Family Medicine

## 2016-01-11 NOTE — Addendum Note (Signed)
Addended by: Janora Norlander on: 01/11/2016 09:14 AM   Modules accepted: Miquel Dunn

## 2016-01-12 LAB — CYTOLOGY - PAP

## 2016-01-16 ENCOUNTER — Encounter: Payer: Self-pay | Admitting: Family Medicine

## 2016-01-23 ENCOUNTER — Encounter: Payer: Self-pay | Admitting: Gynecology

## 2016-01-23 ENCOUNTER — Ambulatory Visit (INDEPENDENT_AMBULATORY_CARE_PROVIDER_SITE_OTHER): Payer: BLUE CROSS/BLUE SHIELD | Admitting: Gynecology

## 2016-01-23 VITALS — BP 122/80 | Ht 63.0 in | Wt 230.0 lb

## 2016-01-23 DIAGNOSIS — N840 Polyp of corpus uteri: Secondary | ICD-10-CM

## 2016-01-23 DIAGNOSIS — N898 Other specified noninflammatory disorders of vagina: Secondary | ICD-10-CM

## 2016-01-23 DIAGNOSIS — D251 Intramural leiomyoma of uterus: Secondary | ICD-10-CM | POA: Diagnosis not present

## 2016-01-23 DIAGNOSIS — N950 Postmenopausal bleeding: Secondary | ICD-10-CM

## 2016-01-23 NOTE — Progress Notes (Signed)
Mackenzie Nichols 18-Apr-1956 [id number]        60 y.o.  [id number]  New patient who presents in consultation from [facility] where she normally obtains her routine healthcare.  She has a long history of leiomyoma which are not causing her any symptoms.  Was evaluated in October for postmenopausal bleeding where ultrasound confirmed multiple myomas with uterus overall measuring 13 x 11.4 x 7.8 cm. Endometrial echo difficult to visualize.  Endometrial biopsy showed an endometrial polyp with hemorrhagic infarction and atrophic endometrium. She was most recently evaluated for a complete exam which showed an enlarged uterus and a vaginal mass and she was referred for further evaluation. Her Pap smear that was done was normal.  Past medical history,surgical history, problem list, medications, allergies, family history and social history were all reviewed and documented as reviewed in the EPIC chart.  ROS:  Performed with pertinent positives and negatives included in the history, assessment and plan.   Additional significant findings :  none   Exam: Caryn Bee assistant Filed Vitals:   01/23/16 1155  BP: 122/80  Height: 5\' 3"  (1.6 m)  Weight: 230 lb (104.327 kg)   General appearance:  Normal affect, orientation and appearance. Skin: Grossly normal HEENT: Without gross lesions.  No cervical or supraclavicular adenopathy. Thyroid normal.  Lungs:  Clear without wheezing, rales or rhonchi Cardiac: RR, without RMG Abdominal:  Soft, nontender, without masses, guarding, rebound, organomegaly or hernia Breasts:  Examined lying and sitting without masses, retractions, discharge or axillary adenopathy. Pelvic:  Ext/BUS/vagina with polypoid mass 3:00 position halfway up the vagina. Appears to be covered with mucosa and not friable.  Cervix atrophic  Uterus enlarged proximally 14 week size irregular consistent with history of leiomyoma.  Adnexa without gross masses or tenderness    Anus and  perineum normal   Rectovaginal normal sphincter tone without palpated masses or tenderness.    Assessment/Plan:  60 y.o. [id number] female with history of leiomyoma dating back years. Asymptomatic as far as pressure or discomfort. Episode of vaginal bleeding this past fall with endometrial biopsy showing an endometrial polyp.  Also with vaginal polypoid mass. Recommended that we start with sonohysterogram to evaluate for endometrial polyp. If present then I would recommend hysteroscopic resection. At this point as she is asymptomatic from her leiomyoma I would not do anything as it appears that her uterus has remained stable in size per ultrasound evaluation dating back years. I would also recommend excising the vaginal mass if we proceed with hysteroscopy D&C at the same time. If no evidence of remaining polyp then would plan expected management from a uterine standpoint but would recommend excising the vaginal mass which we can do in the office. Patient will follow for sonohysterogram and then we will go from there.  Patient is overdue for her mammogram in of recommended that she schedule this. Had recent colonoscopy 2016. Pap smear with HPV was negative 12/2015.     Anastasio Auerbach MD, 12:27 PM 01/23/2016

## 2016-01-23 NOTE — Patient Instructions (Signed)
Follow up for ultrasound as scheduled 

## 2016-01-29 ENCOUNTER — Other Ambulatory Visit: Payer: Self-pay | Admitting: Gynecology

## 2016-01-29 DIAGNOSIS — D251 Intramural leiomyoma of uterus: Secondary | ICD-10-CM

## 2016-01-29 DIAGNOSIS — N950 Postmenopausal bleeding: Secondary | ICD-10-CM

## 2016-01-29 DIAGNOSIS — N9489 Other specified conditions associated with female genital organs and menstrual cycle: Secondary | ICD-10-CM

## 2016-01-30 ENCOUNTER — Ambulatory Visit (INDEPENDENT_AMBULATORY_CARE_PROVIDER_SITE_OTHER): Payer: BLUE CROSS/BLUE SHIELD | Admitting: Family Medicine

## 2016-01-30 ENCOUNTER — Telehealth: Payer: Self-pay | Admitting: Gynecology

## 2016-01-30 ENCOUNTER — Encounter: Payer: Self-pay | Admitting: Family Medicine

## 2016-01-30 VITALS — BP 131/75 | HR 84 | Temp 98.2°F | Wt 226.0 lb

## 2016-01-30 DIAGNOSIS — W57XXXA Bitten or stung by nonvenomous insect and other nonvenomous arthropods, initial encounter: Secondary | ICD-10-CM

## 2016-01-30 DIAGNOSIS — T148 Other injury of unspecified body region: Secondary | ICD-10-CM | POA: Diagnosis not present

## 2016-01-30 MED ORDER — CETIRIZINE HCL 10 MG PO TABS: 10.0000 mg | ORAL_TABLET | Freq: Every day | ORAL | Status: DC

## 2016-01-30 MED ORDER — BETAMETHASONE DIPROPIONATE 0.05 % EX CREA: TOPICAL_CREAM | Freq: Two times a day (BID) | CUTANEOUS | Status: DC

## 2016-01-30 NOTE — Assessment & Plan Note (Signed)
Typical rash distribution in exposed areas only. No evidence of scabies or known exposure to other arthropods. She did change detergents, though distribution is not over exposed areas or very generalized.  - Pruritus: topical steroid with precautions given, po antihistamine - Hand out provided, recommended pest management.

## 2016-01-30 NOTE — Telephone Encounter (Signed)
01/30/16-I LM VM for pt that her [facility] will cover the sonohysterogram/bx at 100%, no copay. Per Sam @BC . [id number]

## 2016-01-30 NOTE — Progress Notes (Signed)
Subjective: Mackenzie Nichols is a 60 y.o. female presenting for an itchy rash.  She reports developing a sparse itchy rash about 2 weeks ago which has continued to evolve. It is itchy red bumps that often occur together, first on the right outer leg, then the left leg, then right neck. Now some are still present on the legs, but the most troublesome area is on the left arm. No medications tried, no similar rash before or people around her with similar rash. No new medications, exposures to ticks, recent travel. She changed laundry detergents to gain pods about 2 weeks ago. No fever, does not feel ill.   - ROS: As above - Non-smoker  Objective: BP 131/75 mmHg  Pulse 84  Temp(Src) 98.2 F (36.8 C) (Oral)  Wt 226 lb (102.513 kg)  LMP 06/15/2013 Gen: Pleasant, obese 60 y.o. female in no distress Skin: Several clusters of 3 - 7 erythematous papule/wheals with central puncta in linear configuration located on right lateral thigh, left shin, left posterior upper arm, right forearm, left neck. No purulence.   Assessment/Plan: [name] is a 60 y.o. female here for bed bug bites.  Bed bug bite Typical rash distribution in exposed areas only. No evidence of scabies or known exposure to other arthropods. She did change detergents, though distribution is not over exposed areas or very generalized.  - Pruritus: topical steroid with precautions given, po antihistamine - Hand out provided, recommended pest management.

## 2016-01-30 NOTE — Patient Instructions (Signed)
What are bedbugs? -- Bedbugs are small bugs that do not fly (picture 1). They are found all over the world. Bedbugs can live in hotels, houses, and other places where people rest and sleep. They can live in your mattress, your clothes, the walls, and other parts of your house. Most often, they bite you while you are sleeping or resting.  If you have bedbugs in your home, you might not be able to see them. They are very small and hide during the day.  Bedbugs do not spread diseases in people.    What do bedbug bites look like? -- Bedbug bites are small, red and swollen areas of the skin that are often:  ?In a row or a line  ?On parts of the body not usually covered by clothes, such as the face, neck, arms, and hands  ?Very itchy    Most people don't feel it when they get bitten. They might notice the bites in the morning or after a day or 2. Bedbug bites can take 3 to 6 weeks to heal. They can get infected if you scratch them a lot.    Is there a test to tell if I have bedbug bites? -- No. There is no test. But your doctor or nurse might suspect that your bites are from bedbugs when he or she looks at your skin. Other types of bugs and some diseases can also cause red bumps on the skin that look like bedbug bites.    The only way to know for sure if you have bedbugs in your home is to catch one. Experts can then inspect the insect. They can tell if it is a bedbug or another type of bug.    Is there anything I can do on my own to help my bites feel better? -- Yes. To help your bites feel better and heal faster, you can:  ?Keep the skin clean and dry  ?Try not to scratch the bites  ?Use an anti-itch lotion or cream to help with the itching    Should I see a doctor or nurse? -- See your doctor or nurse right away if the bites:  ?Get redder  ?Get more swollen  ?Start having pus come out of them    If any of these things happen, your bites might be infected. Your doctor or nurse might prescribe medicine for the  infection.    When you first find out you have bedbugs in your home, you might feel very worried or upset. If you feel this way, talk to your doctor about what you can do.  What should I do about the bedbugs in my home? -- You will need to get rid of the bedbugs in your home so you do not get any more bites. To start, you can:  ?Vacuum your home  ?Wash your clothes and bedding, and dry them in a dryer that is on the hottest setting  ?Clean up your home    Then, you or your landlord should call a pest control service. The workers might use a chemical in your home to get rid of the bedbugs. You should not try to use any chemicals yourself. Another way that pest control services can get rid of bedbugs is a special type of heat treatment. This raises the temperature in your home, which kills the bedbugs.    If you want to get rid of some of your clothes or things, do not give them to other people. The bedbugs could still be in them. You do not want   to spread bedbugs to other people.    Can bedbug bites be prevented? -- The only way to prevent bites is by getting rid of the bedbugs. Also, do not sleep or stay over in a place that you know has bedbugs.

## 2016-02-07 ENCOUNTER — Ambulatory Visit (INDEPENDENT_AMBULATORY_CARE_PROVIDER_SITE_OTHER): Payer: BLUE CROSS/BLUE SHIELD

## 2016-02-07 ENCOUNTER — Ambulatory Visit (INDEPENDENT_AMBULATORY_CARE_PROVIDER_SITE_OTHER): Payer: BLUE CROSS/BLUE SHIELD | Admitting: Gynecology

## 2016-02-07 ENCOUNTER — Encounter: Payer: Self-pay | Admitting: Gynecology

## 2016-02-07 ENCOUNTER — Telehealth: Payer: Self-pay

## 2016-02-07 VITALS — BP 134/84

## 2016-02-07 DIAGNOSIS — D251 Intramural leiomyoma of uterus: Secondary | ICD-10-CM

## 2016-02-07 DIAGNOSIS — N840 Polyp of corpus uteri: Secondary | ICD-10-CM

## 2016-02-07 DIAGNOSIS — N950 Postmenopausal bleeding: Secondary | ICD-10-CM

## 2016-02-07 DIAGNOSIS — N9489 Other specified conditions associated with female genital organs and menstrual cycle: Secondary | ICD-10-CM | POA: Diagnosis not present

## 2016-02-07 DIAGNOSIS — N898 Other specified noninflammatory disorders of vagina: Secondary | ICD-10-CM

## 2016-02-07 NOTE — Progress Notes (Signed)
Mackenzie Nichols Sep 23, 1956 [id number]        60 y.o.  [id number] presents for sonohysterogram with history of leiomyoma, endometrial polyp with episode of postmenopausal bleeding  Past medical history,surgical history, problem list, medications, allergies, family history and social history were all reviewed and documented in the EPIC chart.  Directed ROS with pertinent positives and negatives documented in the history of present illness/assessment and plan.  Exam: [place] assistant Filed Vitals:   02/07/16 1149  BP: 134/84   General appearance:  Normal External BUS vagina with polypoid mass left upper sidewall. Cervix normal. Uterus 14 week size irregular nontender. Adnexa without gross masses or tenderness  Ultrasound shows enlarged uterus with multiple myomas measuring 52mm, 64mm, 68mm, 35mm, 60mm.  Endometrial echo 9.2 mm.  Right and left ovaries grossly normal. Cul-de-sac negative.  Sonohysterogram performed, sterile technique, easy catheter introduction, good distention with 18 x 18 x 24 mm filling defect consistent with endometrial polyp. Endometrial sample taken. Patient tolerated well.  Assessment/Plan:  60 y.o. [id number] with leiomyoma historically stable. Patient without symptoms such as pain or pressure. Endometrial polyp and vaginal lesion noted. Reviewed options to include observation, hysteroscopy with resection of the endometrial polyp and vaginal lesion up to including hysterectomy and excision of the vaginal lesion. As patient is asymptomatic she prefers not to have the hysterectomy. She would prefer proceeding with the hysteroscopy D&C, removal of the endometrial polyp and vaginal mass.  I reviewed the proposed surgery with the patient to include the expected intraoperative and postoperative courses as well as the recovery period. The use of the hysteroscope, resectoscope and the D&C portion, as well as the excision of the vaginal mass was all discussed. The risks of surgery to  include infection, prolonged antibiotics, hemorrhage necessitating transfusion and the risks of transfusion, including transfusion reaction, hepatitis, HIV, mad cow disease and other unknown entities were all discussed understood and accepted. The risk of damage to internal organs during the procedure, either immediately recognized or delay recognized, including vagina, cervix, uterus, possible perforation causing damage to bowel, bladder, ureters, vessels and nerves necessitating major exploratory reparative surgery and future reparative surgeries including bladder repair, ureteral damage repair, bowel resection, ostomy formation was also discussed understood and accepted. The potential for distended media absorption leading to metabolic complications such as fluid overload, coma and seizures was also discussed understood and accepted. The patient's questions were answered to her satisfaction and she wants me to go ahead and schedule surgery.    Anastasio Auerbach MD, 12:25 PM 02/07/2016

## 2016-02-07 NOTE — Telephone Encounter (Signed)
I contacted patient to schedule Hysteroscopy, D&C and exc of vag mass.  We discussed her insurance benefits and her estimated surgery prepayment that would be due to [place].  She said she will need to wait a few months to have surgery in order to arrange this financially. She asked to call me with the May schedule and see if she might be ready to schedule for May. I will call her.

## 2016-02-07 NOTE — Patient Instructions (Signed)
Office will call you to arrange surgery. 

## 2016-02-08 ENCOUNTER — Other Ambulatory Visit: Payer: Self-pay | Admitting: Family Medicine

## 2016-02-20 IMAGING — US US PELVIS COMPLETE
1 series · 15 of 25 positions shown · non-contrast
Comparison: 06/24/2013 pelvic sonogram.

CLINICAL DATA: 59-year-old postmenopausal female with 2 weeks of
postmenopausal bleeding. History of uterine fibroids.

EXAM:
TRANSABDOMINAL ULTRASOUND OF PELVIS
TECHNIQUE: Transabdominal ultrasound examination of the pelvis was performed
including evaluation of the uterus, ovaries, adnexal regions, and
pelvic cul-de-sac.

[Series 1: us pelvis complete · 15 of 55 slices shown]
[im 1/55]
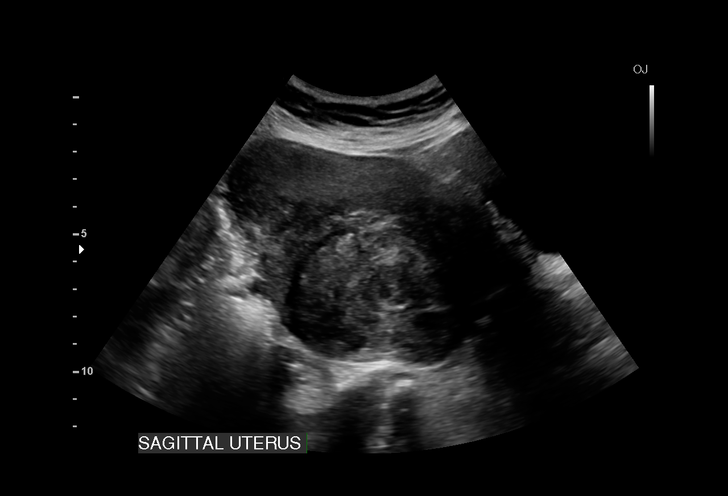
[im 5/55]
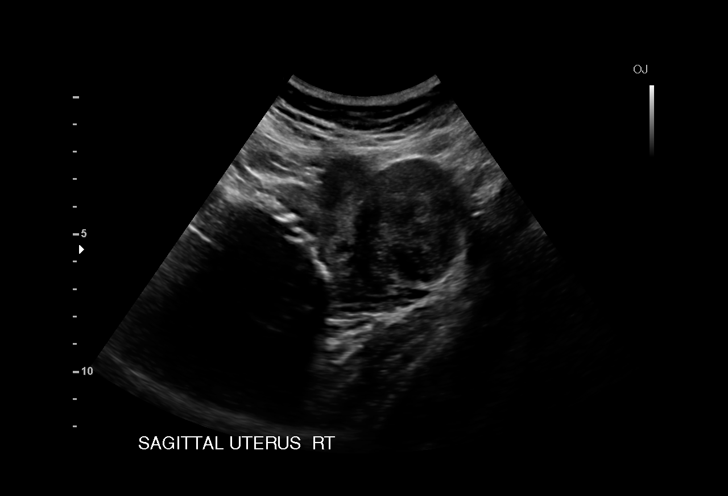
[im 10/55]
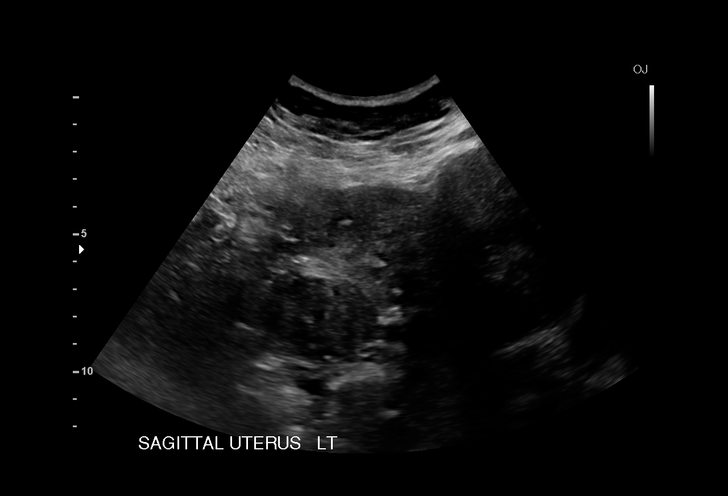
[im 12/55]
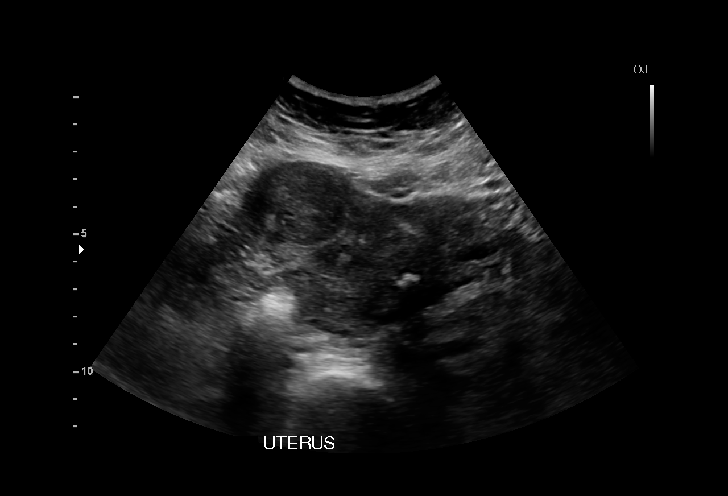
[im 16/55]
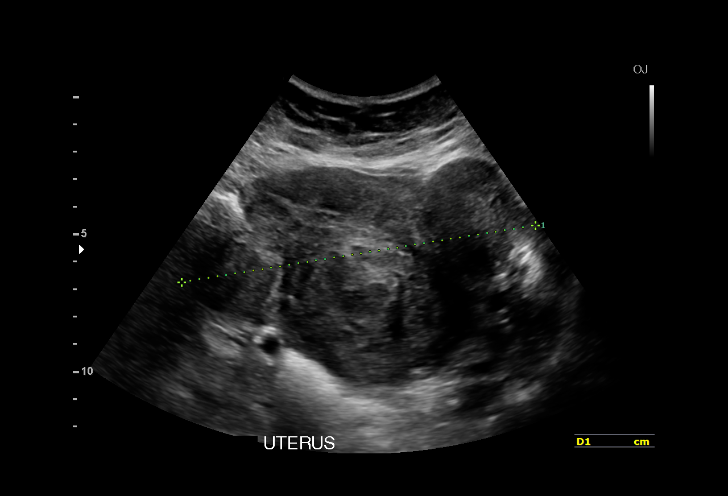
[im 21/55]
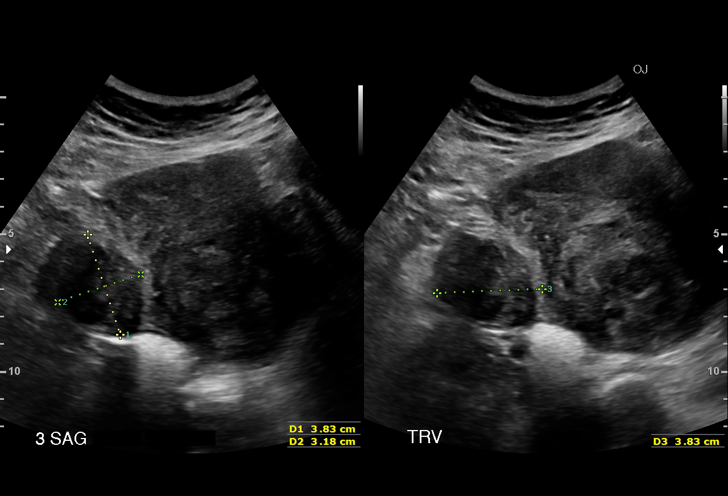
[im 23/55]
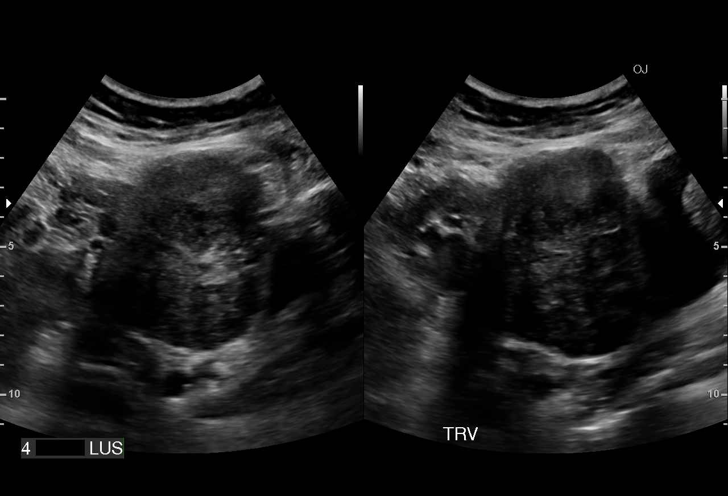
[im 28/55]
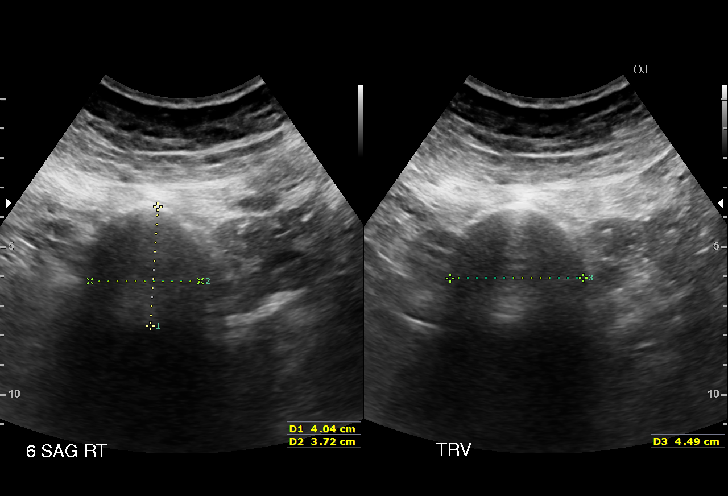
[im 32/55]
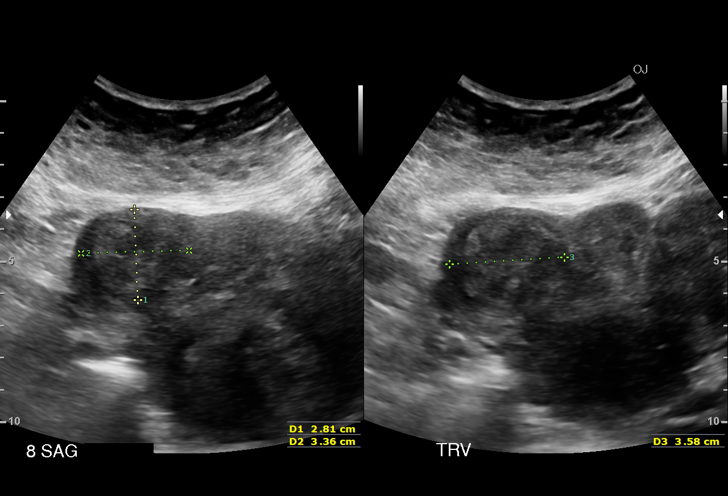
[im 34/55]
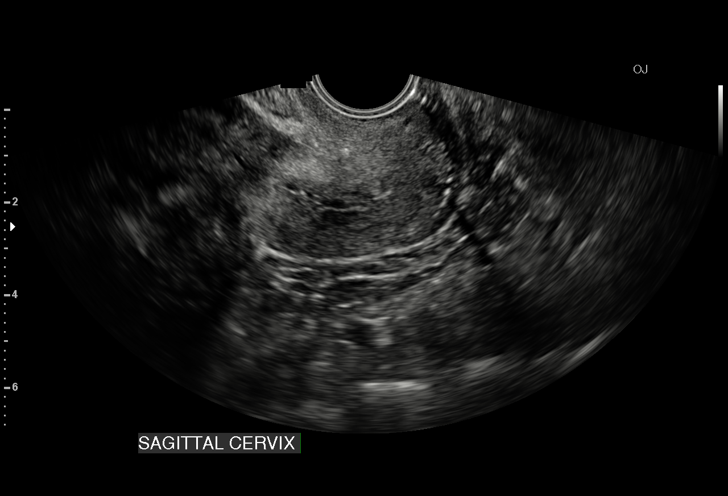
[im 39/55]
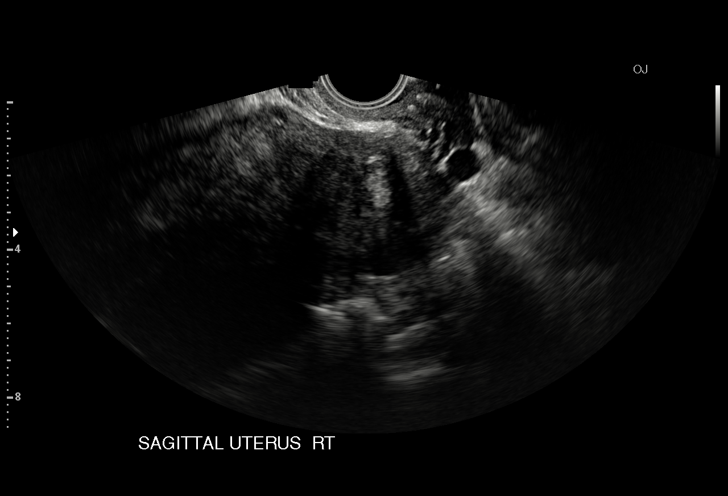
[im 43/55]
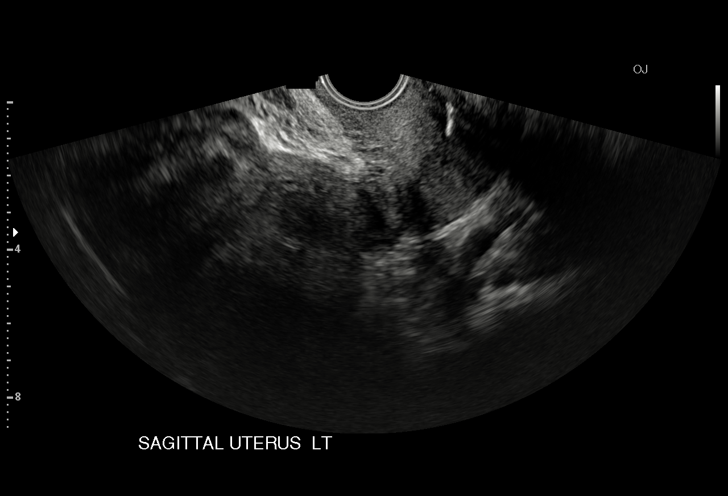
[im 46/55]
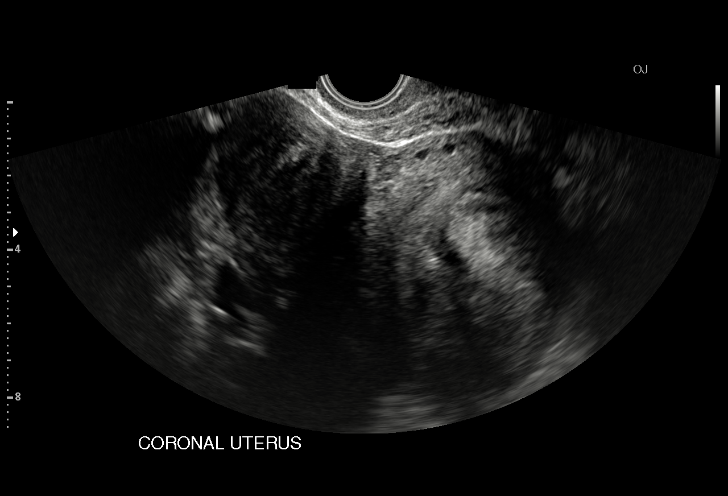
[im 50/55]
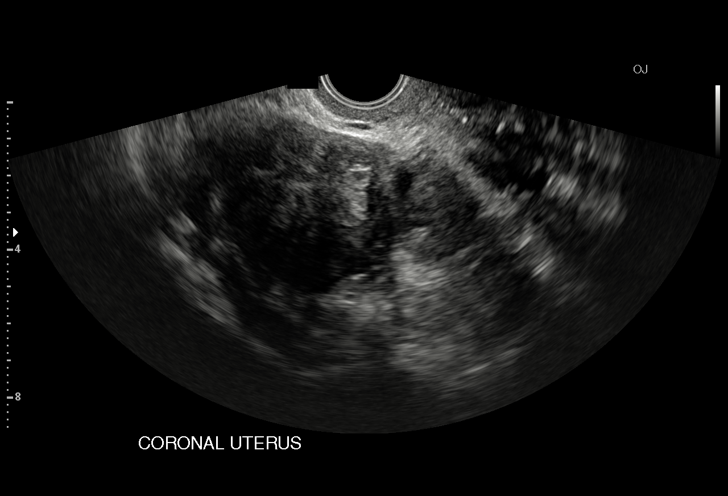
[im 55/55]
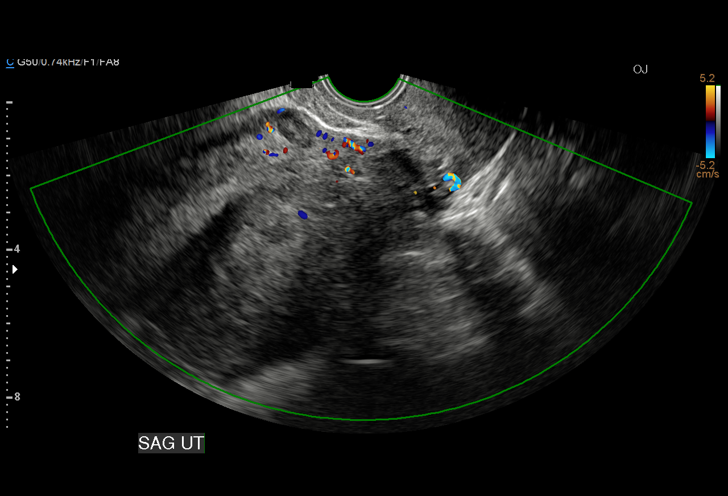

[15 of 25 positions shown; findings below may reference images not displayed]

FINDINGS: Uterus

Measurements: 11.4 x 7.8 x 13.0 cm. The anteverted uterus is
prominently enlarged by multiple fibroids, with representative
fibroids as follows:

- posterior uterine body intramural 5.7 x 6.5 x 6.3 cm fibroid,
previously 6.1 x 5.1 x 6.0 cm, stable to minimally increased in size

- posterior fundal subserosal 3.8 x 3.2 x 3.8 cm fibroid, previously
4.9 x 3.3 x 3.7 cm, not appreciably changed accounting for
differences in measurement technique

-anterior lower uterine segment subserosal 3.0 x 3.4 x 4.4 cm
fibroid, likely corresponding to the previously described 4.4 x
x 3.8 cm fibroid, not appreciably changed

Endometrium

Endometrium is poorly visualized due to mass effect/distortion by
the surrounding fibroids, and evaluation of the endometrium is
nondiagnostic on this scan.

Right ovary

Not visualized.  No right adnexal mass demonstrated.

Left ovary

Not visualized.  No left adnexal mass demonstrated.

Other findings:  No abnormal free fluid in the pelvis.
IMPRESSION: 1. Enlarged myomatous uterus, with no significant change in the
uterine fibroids.
2. Nondiagnostic evaluation of the endometrium due to distortion by
surrounding fibroids.
3. Nonvisualization of the ovaries.  No suspicious adnexal findings.

## 2016-02-21 ENCOUNTER — Telehealth: Payer: Self-pay

## 2016-02-21 NOTE — Telephone Encounter (Signed)
I called patient to let her know I now have my May schedule available and to please call me when she can so that I can get her surgery scheduled for her.

## 2016-02-22 ENCOUNTER — Telehealth: Payer: Self-pay

## 2016-02-22 MED ORDER — MISOPROSTOL 200 MCG PO TABS: ORAL_TABLET | ORAL | Status: DC

## 2016-02-22 NOTE — Telephone Encounter (Signed)
Patient called ready to schedule her surgery for May.  Surgery scheduled for 05/14/16 at 7:30am at [facility]. Pre op consult was scheduled. Previously we discussed her ins benefits and her estimated surgery prepayment due. She said today she is going to try to make that in 2 payments prior to surgery.  Financial letter will be sent as well.    I explained need for Cytotec tab vaginally hs before surgery and Rx was sent.

## 2016-03-07 ENCOUNTER — Encounter: Payer: Self-pay | Admitting: Gynecology

## 2016-04-16 ENCOUNTER — Telehealth: Payer: Self-pay

## 2016-04-16 NOTE — Telephone Encounter (Signed)
Patient called to say that she needs to reschedule her Hyst., D&C, Exc vag mass until June or July. She said she has had an emergency and also she needs time to plan financially.  I called her back and left message with some dates and asked her to call me.

## 2016-04-19 NOTE — Telephone Encounter (Signed)
Patient called in voice mail stating she would like surgery the last week in July. I will wait on July schedule and call her when I get it.

## 2016-05-03 ENCOUNTER — Institutional Professional Consult (permissible substitution): Payer: BLUE CROSS/BLUE SHIELD | Admitting: Gynecology

## 2016-05-03 ENCOUNTER — Inpatient Hospital Stay (HOSPITAL_COMMUNITY): Admission: RE | Admit: 2016-05-03 | Payer: Self-pay | Source: Ambulatory Visit

## 2016-05-06 ENCOUNTER — Institutional Professional Consult (permissible substitution): Payer: BLUE CROSS/BLUE SHIELD | Admitting: Gynecology

## 2016-05-31 ENCOUNTER — Telehealth: Payer: Self-pay

## 2016-05-31 NOTE — Telephone Encounter (Signed)
Patient called yesterday and relayed to me that she no longer has [employer] ins and has new [employer]. I called today and checked benefits on it. It does not change her [facility] surgery prepayment amount that is due. I left her a voice mail and told her that and also relayed her ded/co-ins/oop max info to her. She was instructed to bring card to her pre op visit so that we can get her info updated prior to surgery.

## 2016-06-27 NOTE — Patient Instructions (Addendum)
Your procedure is scheduled on:  Tuesday, July 09, 2016  Enter through the Main Entrance of [facility] at:  10:30 AM  Pick up the phone at the desk and dial [phone].  Call this number if you have problems the morning of surgery: [phone].  Remember: Do NOT eat food:  Midnight Monday, July 08, 2016  Do NOT drink clear liquids after:  8:00 AM day of surgery  Take these medicines the morning of surgery with a SIP OF WATER:  Amlodipine  Stop taking fish oil at this time.  Do NOT wear jewelry (body piercing), metal hair clips/bobby pins, make-up, or nail polish. Do NOT wear lotions, powders, or perfumes.  You may wear deodorant. Do NOT shave for 48 hours prior to surgery. Do NOT bring valuables to the hospital. Contacts, dentures, or bridgework may not be worn into surgery.  Have a responsible adult drive you home and stay with you for 24 hours after your procedure

## 2016-06-28 ENCOUNTER — Inpatient Hospital Stay (HOSPITAL_COMMUNITY)
Admission: RE | Admit: 2016-06-28 | Discharge: 2016-06-28 | Disposition: A | Discharge status: Home / Self Care | Payer: Self-pay | Source: Ambulatory Visit

## 2016-06-28 ENCOUNTER — Ambulatory Visit (INDEPENDENT_AMBULATORY_CARE_PROVIDER_SITE_OTHER): Payer: 59 | Admitting: Gynecology

## 2016-06-28 ENCOUNTER — Encounter: Payer: Self-pay | Admitting: Gynecology

## 2016-06-28 VITALS — BP 124/80

## 2016-06-28 DIAGNOSIS — N840 Polyp of corpus uteri: Secondary | ICD-10-CM | POA: Diagnosis not present

## 2016-06-28 DIAGNOSIS — D251 Intramural leiomyoma of uterus: Secondary | ICD-10-CM

## 2016-06-28 NOTE — Patient Instructions (Signed)
Followup for surgery as scheduled. 

## 2016-06-28 NOTE — H&P (Signed)
  Mackenzie Nichols [birth year] [id number]   History and Physical  Chief complaint: Endometrial polyp, leiomyoma  History of present illness: 60 y.o. [id number] with history of postmenopausal bleeding October 2016. Underwent endometrial biopsy through her family practice clinic that showed an endometrial polyp with hemorrhagic infarction. Was followed expectantly until February 2017 where exam showed an enlarged uterus and polypoid vaginal mass.  Patient was referred to gynecology for further evaluation. Has long history of leiomyoma, asymptomatic without pressure, pain or bleeding abnormalities. Has done no further postmenopausal bleeding. Sonohysterogram performed showed an enlarged uterus with multiple myomas measuring 82 mm, 49 mm, 46 mm, 44 mm, 31 mm. Endometrial echo 9.2 mm.  Sonohysterogram showed 18 x 18 x 24 mm filling defect within the cavity consistent with endometrial polyp. Endometrial biopsy did not show endometrial tissue.  Exam showed a 14 week size enlarged uterus with polypoid vaginal mass left upper sidewall. Patient is admitted for hysteroscopy D&C resection of endometrial polyp and vaginal mass. Was counseled for options to include hysterectomy the patient declined as she is asymptomatic from her leiomyoma and prefers a minimal approach.  Past medical history,surgical history, medications, allergies, family history and social history were all reviewed and documented in the EPIC chart.  ROS:  Was performed and pertinent positives and negatives are included in the history of present illness.  Exam:  Caryn Bee assistant 06/28/2016 Filed Vitals:   06/28/16 0900  BP: 124/80   General: well developed, well nourished female, no acute distress HEENT: normal  Lungs: clear to auscultation without wheezing, rales or rhonchi  Cardiac: regular rate without rubs, murmurs or gallops  Abdomen: soft, nontender without masses, guarding, rebound, organomegaly  Pelvic: external bus vaginaWith  polypoid 3 cm pedunculated mass upper left vaginal sidewall. With smooth mucosal covering.   Cervix: grossly normal  Uterus14 week size irregular consistent with history of leiomyoma Adnexa: without  gross masses or tenderness  Rectovaginal exam within normal limits    Assessment/Plan:  60 y.o. [id number] With history as above for hysteroscopy D&C and resection of polypoid vaginal mass.  I reviewed the proposed surgery with the patient to include the expected intraoperative and postoperative courses as well as the recovery period. The use of the hysteroscope, resectoscope and the D&C portion were all discussed. The risks of surgery to include infection, prolonged antibiotics, hemorrhage necessitating transfusion and the risks of transfusion, including transfusion reaction, hepatitis, HIV, mad cow disease and other unknown entities were all discussed understood and accepted. The risk of damage to internal organs during the procedure, either immediately recognized or delay recognized, including vagina, cervix, uterus, possible perforation causing damage to bowel, bladder, ureters, vessels and nerves necessitating major exploratory reparative surgery and future reparative surgeries including bladder repair, ureteral damage repair, bowel resection, ostomy formation was also discussed understood and accepted. The potential for distended media absorption leading to metabolic complications such as fluid overload, coma and seizures was also discussed understood and accepted. The patient's questions were answered to her satisfaction and she is ready to proceed with surgery.    Anastasio Auerbach MD, 9:20 AM 06/28/2016

## 2016-06-28 NOTE — Progress Notes (Signed)
Mackenzie Nichols [birth year] [id number]   Preoperative consult  Chief complaint: Endometrial polyp, leiomyoma  History of present illness: Mackenzie Nichols y.o. [id number] with history of postmenopausal bleeding October 2016. Underwent endometrial biopsy through her family practice clinic that showed an endometrial polyp with hemorrhagic infarction. Was followed expectantly until February 2017 where exam showed an enlarged uterus and polypoid vaginal mass.  Mackenzie Nichols was referred to gynecology for further evaluation. Has long history of leiomyoma, asymptomatic without pressure, pain or bleeding abnormalities. Has done no further postmenopausal bleeding. Sonohysterogram performed showed an enlarged uterus with multiple myomas measuring 82 mm, 49 mm, 46 mm, 44 mm, 31 mm. Endometrial echo 9.2 mm.  Sonohysterogram showed 18 x 18 x 24 mm filling defect within the cavity consistent with endometrial polyp. Endometrial biopsy did not show endometrial tissue.  Exam showed a 14 week size enlarged uterus with polypoid vaginal mass left upper sidewall. Mackenzie Nichols is admitted for hysteroscopy D&C resection of endometrial polyp and vaginal mass. Was counseled for options to include hysterectomy the Mackenzie Nichols declined as she is asymptomatic from her leiomyoma and prefers a minimal approach.  Past medical history,surgical history, medications, allergies, family history and social history were all reviewed and documented in the EPIC chart.  ROS:  Was performed and pertinent positives and negatives are included in the history of present illness.  Exam:  Caryn Bee assistant  Filed Vitals:   06/28/16 0900  BP: 124/80   General: well developed, well nourished female, no acute distress HEENT: normal  Lungs: clear to auscultation without wheezing, rales or rhonchi  Cardiac: regular rate without rubs, murmurs or gallops  Abdomen: soft, nontender without masses, guarding, rebound, organomegaly  Pelvic: external bus vaginaWith polypoid 3 cm  pedunculated mass upper left vaginal sidewall. With smooth mucosal covering.   Cervix: grossly normal  Uterus14 week size irregular consistent with history of leiomyoma Adnexa: without  gross masses or tenderness  Rectovaginal exam within normal limits    Assessment/Plan:  Mackenzie y.o. Mackenzie [id number] With history as above for hysteroscopy D&C and resection of polypoid vaginal mass.  I reviewed the proposed surgery with the Mackenzie Nichols to include the expected intraoperative and postoperative courses as well as the recovery period. The use of the hysteroscope, resectoscope and the D&C portion were all discussed. The risks of surgery to include infection, prolonged antibiotics, hemorrhage necessitating transfusion and the risks of transfusion, including transfusion reaction, hepatitis, HIV, mad cow disease and other unknown entities were all discussed understood and accepted. The risk of damage to internal organs during the procedure, either immediately recognized or delay recognized, including vagina, cervix, uterus, possible perforation causing damage to bowel, bladder, ureters, vessels and nerves necessitating major exploratory reparative surgery and future reparative surgeries including bladder repair, ureteral damage repair, bowel resection, ostomy formation was also discussed understood and accepted. The potential for distended media absorption leading to metabolic complications such as fluid overload, coma and seizures was also discussed understood and accepted. The Mackenzie Nichols's questions were answered to her satisfaction and she is ready to proceed with surgery.    Anastasio Auerbach MD, 9:13 AM 06/28/2016

## 2016-07-02 NOTE — Patient Instructions (Signed)
Your procedure is scheduled on:  Tuesday, July 09, 2016  Enter through the Main Entrance of [facility] at:  10:30 AM  Pick up the phone at the desk and dial [phone].  Call this number if you have problems the morning of surgery: [phone].  Remember: Do NOT eat food:  After Midnight Monday, July 08, 2016  Do NOT drink clear liquids after:  8:00 AM day of surgery  Take these medicines the morning of surgery with a SIP OF WATER:  Amlodipine  Stop taking fish oil at this time  Do NOT wear jewelry (body piercing), metal hair clips/bobby pins, make-up, or nail polish. Do NOT wear lotions, powders, or perfumes.  You may wear deodorant. Do NOT shave for 48 hours prior to surgery. Do NOT bring valuables to the hospital. Contacts, dentures, or bridgework may not be worn into surgery.  Have a responsible adult drive you home and stay with you for 24 hours after your procedure

## 2016-07-03 ENCOUNTER — Inpatient Hospital Stay (HOSPITAL_COMMUNITY)
Admission: RE | Admit: 2016-07-03 | Discharge: 2016-07-03 | Disposition: A | Discharge status: Home / Self Care | Payer: Self-pay | Source: Ambulatory Visit

## 2016-07-04 NOTE — Patient Instructions (Signed)
Your procedure is scheduled on:  Wednesday, 7/25  Enter through the Main Entrance of [facility] at: 10:30am  Pick up the phone at the desk and dial [phone].  Call this number if you have problems the morning of surgery: [phone].  Remember: Do NOT eat food after midnight Tuesday, 7/24  Do NOT drink clear liquids after 8am Wednesday, day of surgery  Take these medicines the morning of surgery with a SIP OF WATER:  amLODipine   Do NOT wear jewelry (body piercing), metal hair clips/bobby pins, make-up, or nail polish. Do NOT wear lotions, powders, or perfumes.  You may wear deoderant. Do NOT shave for 48 hours prior to surgery. Do NOT bring valuables to the hospital. Contacts, dentures, or bridgework may not be worn into surgery.  Have a responsible adult drive you home and stay with you for 24 hours after your procedure.

## 2016-07-05 ENCOUNTER — Other Ambulatory Visit: Payer: Self-pay

## 2016-07-05 ENCOUNTER — Encounter (HOSPITAL_COMMUNITY)
Admission: RE | Admit: 2016-07-05 | Discharge: 2016-07-05 | Disposition: A | Discharge status: Home / Self Care | Payer: 59 | Source: Ambulatory Visit | Attending: Gynecology | Admitting: Gynecology

## 2016-07-05 ENCOUNTER — Encounter (HOSPITAL_COMMUNITY): Payer: Self-pay

## 2016-07-05 DIAGNOSIS — Z01812 Encounter for preprocedural laboratory examination: Secondary | ICD-10-CM | POA: Diagnosis not present

## 2016-07-05 DIAGNOSIS — N840 Polyp of corpus uteri: Secondary | ICD-10-CM | POA: Insufficient documentation

## 2016-07-05 DIAGNOSIS — D259 Leiomyoma of uterus, unspecified: Secondary | ICD-10-CM | POA: Diagnosis not present

## 2016-07-05 HISTORY — DX: Prediabetes: R73.03

## 2016-07-05 LAB — COMPREHENSIVE METABOLIC PANEL
ALBUMIN: 3.5 g/dL (ref 3.5–5.0)
ALK PHOS: 42 U/L (ref 38–126)
ALT: 14 U/L (ref 14–54)
ALT: 14 U/L (ref 14–54)
ANION GAP: 8 (ref 5–15)
AST: 18 U/L (ref 15–41)
AST: 18 U/L (ref 15–41)
Albumin: 3.5 g/dL (ref 3.5–5.0)
Alkaline Phosphatase: 42 U/L (ref 38–126)
Anion gap: 8 (ref 5–15)
BILIRUBIN TOTAL: 0.4 mg/dL (ref 0.3–1.2)
BUN: 10 mg/dL (ref 6–20)
BUN: 18 mg/dL (ref 6–20)
CO2: 22 mmol/L (ref 22–32)
CO2: 27 mmol/L (ref 22–32)
CREATININE: 0.94 mg/dL (ref 0.44–1.00)
Calcium: 8.9 mg/dL (ref 8.9–10.3)
Calcium: 9.4 mg/dL (ref 8.9–10.3)
Chloride: 102 mmol/L (ref 101–111)
Chloride: 106 mmol/L (ref 101–111)
Creatinine, Ser: 0.86 mg/dL (ref 0.44–1.00)
GFR calc Af Amer: >60 mL/min (ref >60)
GFR calc Af Amer: >60 mL/min (ref >60)
GFR calc non Af Amer: >60 mL/min (ref >60)
GLUCOSE: 174 mg/dL — AB (ref 65–99)
Glucose, Bld: 123 mg/dL — ABNORMAL HIGH (ref 65–99)
POTASSIUM: 4.4 mmol/L (ref 3.5–5.1)
Potassium: 3.7 mmol/L (ref 3.5–5.1)
SODIUM: 136 mmol/L (ref 135–145)
Sodium: 137 mmol/L (ref 135–145)
TOTAL PROTEIN: 7.7 g/dL (ref 6.5–8.1)
Total Bilirubin: 0.4 mg/dL (ref 0.3–1.2)
Total Protein: 7.7 g/dL (ref 6.5–8.1)

## 2016-07-05 LAB — CBC
HCT: 37.3 % (ref 36.0–46.0)
HEMOGLOBIN: 12.8 g/dL (ref 12.0–15.0)
MCH: 28.8 pg (ref 26.0–34.0)
MCHC: 34.3 g/dL (ref 30.0–36.0)
MCV: 84 fL (ref 78.0–100.0)
Platelets: 349 10*3/uL (ref 150–400)
RBC: 4.44 MIL/uL (ref 3.87–5.11)
RDW: 13.8 % (ref 11.5–15.5)
WBC: 5.4 10*3/uL (ref 4.0–10.5)

## 2016-07-08 ENCOUNTER — Encounter (HOSPITAL_COMMUNITY): Payer: Self-pay

## 2016-07-08 ENCOUNTER — Telehealth: Payer: Self-pay | Admitting: *Deleted

## 2016-07-08 MED ORDER — CEFOTETAN DISODIUM 2 G IJ SOLR
2.0000 g | INTRAMUSCULAR | Status: AC
  Administered 2016-07-09: 2 g via INTRAVENOUS
  Filled 2016-07-08: qty 2

## 2016-07-08 MED ORDER — MISOPROSTOL 200 MCG PO TABS: ORAL_TABLET | ORAL | 0 refills | Status: DC

## 2016-07-08 NOTE — Telephone Encounter (Signed)
Pt never pickup Rx for cytotec 200 mg when it was sent in March. Pt asked if I could sent Rx to another pharmacy. Rx was sent to [facility] on [address].

## 2016-07-08 NOTE — H&P (Signed)
EKG REVIEWED AND OK'D BY DR. HOLIS ON 07/05/16

## 2016-07-09 ENCOUNTER — Ambulatory Visit (HOSPITAL_COMMUNITY): Payer: 59 | Admitting: Certified Registered Nurse Anesthetist

## 2016-07-09 ENCOUNTER — Encounter (HOSPITAL_COMMUNITY)
Admission: RE | Disposition: A | Discharge status: Home / Self Care | Payer: Self-pay | Source: Ambulatory Visit | Attending: Gynecology

## 2016-07-09 ENCOUNTER — Encounter (HOSPITAL_COMMUNITY): Payer: Self-pay | Admitting: Certified Registered Nurse Anesthetist

## 2016-07-09 ENCOUNTER — Ambulatory Visit (HOSPITAL_COMMUNITY)
Admission: RE | Admit: 2016-07-09 | Discharge: 2016-07-09 | Disposition: A | Discharge status: Home / Self Care | Payer: 59 | Source: Ambulatory Visit | Attending: Gynecology | Admitting: Gynecology

## 2016-07-09 DIAGNOSIS — N840 Polyp of corpus uteri: Secondary | ICD-10-CM | POA: Insufficient documentation

## 2016-07-09 DIAGNOSIS — Z6841 Body Mass Index (BMI) 40.0 and over, adult: Secondary | ICD-10-CM | POA: Diagnosis not present

## 2016-07-09 DIAGNOSIS — I10 Essential (primary) hypertension: Secondary | ICD-10-CM | POA: Insufficient documentation

## 2016-07-09 DIAGNOSIS — N9489 Other specified conditions associated with female genital organs and menstrual cycle: Secondary | ICD-10-CM | POA: Diagnosis not present

## 2016-07-09 DIAGNOSIS — D251 Intramural leiomyoma of uterus: Secondary | ICD-10-CM | POA: Diagnosis not present

## 2016-07-09 DIAGNOSIS — N842 Polyp of vagina: Secondary | ICD-10-CM | POA: Diagnosis not present

## 2016-07-09 DIAGNOSIS — E119 Type 2 diabetes mellitus without complications: Secondary | ICD-10-CM | POA: Insufficient documentation

## 2016-07-09 DIAGNOSIS — N950 Postmenopausal bleeding: Secondary | ICD-10-CM | POA: Diagnosis not present

## 2016-07-09 HISTORY — PX: DILATATION & CURETTAGE/HYSTEROSCOPY WITH MYOSURE: SHX6511

## 2016-07-09 HISTORY — PX: EXCISION VAGINAL CYST: SHX5825

## 2016-07-09 LAB — GLUCOSE, CAPILLARY: Glucose-Capillary: 78 mg/dL (ref 65–99)

## 2016-07-09 SURGERY — DILATATION & CURETTAGE/HYSTEROSCOPY WITH MYOSURE
Anesthesia: General | Site: Vagina

## 2016-07-09 MED ORDER — TRAMADOL HCL 50 MG PO TABS: 50.0000 mg | ORAL_TABLET | Freq: Four times a day (QID) | ORAL | 0 refills | Status: DC | PRN

## 2016-07-09 MED ORDER — PROMETHAZINE HCL 25 MG/ML IJ SOLN: 6.2500 mg | INTRAMUSCULAR | Status: DC | PRN

## 2016-07-09 MED ORDER — KETOROLAC TROMETHAMINE 30 MG/ML IJ SOLN
INTRAMUSCULAR | Status: DC | PRN
  Administered 2016-07-09: 30 mg via INTRAVENOUS

## 2016-07-09 MED ORDER — FERRIC SUBSULFATE 259 MG/GM EX SOLN
CUTANEOUS | Status: AC
  Filled 2016-07-09: qty 8

## 2016-07-09 MED ORDER — FENTANYL CITRATE (PF) 100 MCG/2ML IJ SOLN
INTRAMUSCULAR | Status: DC | PRN
  Administered 2016-07-09: 25 ug via INTRAVENOUS
  Administered 2016-07-09: 50 ug via INTRAVENOUS
  Administered 2016-07-09: 25 ug via INTRAVENOUS

## 2016-07-09 MED ORDER — DEXAMETHASONE SODIUM PHOSPHATE 4 MG/ML IJ SOLN
INTRAMUSCULAR | Status: AC
  Filled 2016-07-09: qty 1

## 2016-07-09 MED ORDER — MIDAZOLAM HCL 5 MG/5ML IJ SOLN
INTRAMUSCULAR | Status: DC | PRN
  Administered 2016-07-09: 2 mg via INTRAVENOUS

## 2016-07-09 MED ORDER — LIDOCAINE 2% (20 MG/ML) 5 ML SYRINGE
INTRAMUSCULAR | Status: DC | PRN
  Administered 2016-07-09: 75 mg via INTRAVENOUS

## 2016-07-09 MED ORDER — LIDOCAINE HCL 1 % IJ SOLN
INTRAMUSCULAR | Status: DC | PRN
  Administered 2016-07-09: 10 mL

## 2016-07-09 MED ORDER — SCOPOLAMINE 1 MG/3DAYS TD PT72
1.0000 | MEDICATED_PATCH | Freq: Once | TRANSDERMAL | Status: DC
  Administered 2016-07-09: 1.5 mg via TRANSDERMAL

## 2016-07-09 MED ORDER — DEXAMETHASONE SODIUM PHOSPHATE 4 MG/ML IJ SOLN
INTRAMUSCULAR | Status: DC | PRN
  Administered 2016-07-09: 4 mg via INTRAVENOUS

## 2016-07-09 MED ORDER — KETOROLAC TROMETHAMINE 30 MG/ML IJ SOLN
INTRAMUSCULAR | Status: AC
  Filled 2016-07-09: qty 1

## 2016-07-09 MED ORDER — MIDAZOLAM HCL 2 MG/2ML IJ SOLN
INTRAMUSCULAR | Status: AC
  Filled 2016-07-09: qty 2

## 2016-07-09 MED ORDER — PROPOFOL 10 MG/ML IV BOLUS
INTRAVENOUS | Status: AC
  Filled 2016-07-09: qty 20

## 2016-07-09 MED ORDER — PROPOFOL 10 MG/ML IV BOLUS
INTRAVENOUS | Status: DC | PRN
  Administered 2016-07-09: 150 mg via INTRAVENOUS

## 2016-07-09 MED ORDER — ONDANSETRON HCL 4 MG/2ML IJ SOLN
INTRAMUSCULAR | Status: AC
  Filled 2016-07-09: qty 2

## 2016-07-09 MED ORDER — SCOPOLAMINE 1 MG/3DAYS TD PT72
MEDICATED_PATCH | TRANSDERMAL | Status: AC
  Administered 2016-07-09: 1.5 mg via TRANSDERMAL
  Filled 2016-07-09: qty 1

## 2016-07-09 MED ORDER — FERRIC SUBSULFATE SOLN
Status: DC | PRN
  Administered 2016-07-09: 1

## 2016-07-09 MED ORDER — FENTANYL CITRATE (PF) 100 MCG/2ML IJ SOLN: 25.0000 ug | INTRAMUSCULAR | Status: DC | PRN

## 2016-07-09 MED ORDER — FENTANYL CITRATE (PF) 100 MCG/2ML IJ SOLN
INTRAMUSCULAR | Status: AC
  Filled 2016-07-09: qty 2

## 2016-07-09 MED ORDER — LACTATED RINGERS IV SOLN
INTRAVENOUS | Status: DC
  Administered 2016-07-09: 11:00:00 via INTRAVENOUS
  Administered 2016-07-09: 125 mL/h via INTRAVENOUS

## 2016-07-09 MED ORDER — ONDANSETRON HCL 4 MG/2ML IJ SOLN
INTRAMUSCULAR | Status: DC | PRN
  Administered 2016-07-09: 4 mg via INTRAVENOUS

## 2016-07-09 MED ORDER — LIDOCAINE HCL 1 % IJ SOLN
INTRAMUSCULAR | Status: AC
  Filled 2016-07-09: qty 20

## 2016-07-09 SURGICAL SUPPLY — 31 items
APPLICATOR COTTON TIP 6IN STRL (MISCELLANEOUS) IMPLANT
BLADE SURG 15 STRL LF C SS BP (BLADE) ×2 IMPLANT
BLADE SURG 15 STRL SS (BLADE)
CATH ROBINSON RED A/P 16FR (CATHETERS) ×4 IMPLANT
CLOTH BEACON ORANGE TIMEOUT ST (SAFETY) ×4 IMPLANT
CONTAINER PREFILL 10% NBF 15ML (MISCELLANEOUS) ×8 IMPLANT
CONTAINER PREFILL 10% NBF 60ML (FORM) ×8 IMPLANT
COUNTER NEEDLE 1200 MAGNETIC (NEEDLE) IMPLANT
DEVICE MYOSURE LITE (MISCELLANEOUS) IMPLANT
DEVICE MYOSURE REACH (MISCELLANEOUS) ×2 IMPLANT
ELECT LOOP LEEP RND 15X12 GRN (CUTTING LOOP) ×4
ELECTRODE LOOP LP RND 15X12GRN (CUTTING LOOP) IMPLANT
FILTER ARTHROSCOPY CONVERTOR (FILTER) ×4 IMPLANT
GLOVE BIO SURGEON STRL SZ7.5 (GLOVE) ×8 IMPLANT
GLOVE BIOGEL PI IND STRL 7.0 (GLOVE) ×2 IMPLANT
GLOVE BIOGEL PI INDICATOR 7.0 (GLOVE) ×2
GOWN STRL REUS W/TWL LRG LVL3 (GOWN DISPOSABLE) ×8 IMPLANT
NS IRRIG 1000ML POUR BTL (IV SOLUTION) ×4 IMPLANT
PACK VAGINAL MINOR WOMEN LF (CUSTOM PROCEDURE TRAY) ×4 IMPLANT
PAD OB MATERNITY 4.3X12.25 (PERSONAL CARE ITEMS) ×4 IMPLANT
PAD PREP 24X48 CUFFED NSTRL (MISCELLANEOUS) ×4 IMPLANT
SEAL ROD LENS SCOPE MYOSURE (ABLATOR) ×4 IMPLANT
SUT SILK 3 0 FS 1X18 (SUTURE) IMPLANT
SUT VIC AB 2-0 CT1 27 (SUTURE)
SUT VIC AB 2-0 CT1 TAPERPNT 27 (SUTURE) IMPLANT
SUT VIC AB 3-0 PS2 18 (SUTURE)
SUT VIC AB 3-0 PS2 18XBRD (SUTURE) ×2 IMPLANT
TOWEL OR 17X24 6PK STRL BLUE (TOWEL DISPOSABLE) ×8 IMPLANT
TUBING AQUILEX INFLOW (TUBING) ×4 IMPLANT
TUBING AQUILEX OUTFLOW (TUBING) ×6 IMPLANT
WATER STERILE IRR 1000ML POUR (IV SOLUTION) ×4 IMPLANT

## 2016-07-09 NOTE — Op Note (Signed)
Mackenzie Nichols [birth year] [id number]   Post Operative Note   Date of surgery:  07/09/2016  Pre Op Dx:  Endometrial polyp, leiomyoma, postmenopausal bleeding, vaginal mass  Post Op Dx:  Endometrial polyp, leiomyoma, postmenopausal bleeding, vaginal mass  Procedure:  Hysteroscopy D&C with Myosure resection of endometrial polyp, excision vaginal mass  Surgeon:  Donalynn Furlong P  Anesthesia:  General  EBL:  Minimal  Distended media discrepancy:  [id number] cc saline  Complications:  None  Specimen:  #1 endometrial curetting #2 endometrial polyp fragments #3 vaginal mass to pathology  Findings: EUA:  External BUS vagina with atrophic changes. Pedunculated vaginal mass upper left lateral vaginal sidewall. Cervix atrophic. Uterus bulky approximately 16 week size irregular consistent with leiomyoma. Adnexa without gross masses   Hysteroscopy: Adequate noting right/left tubal ostia, fundus, anterior/posterior endometrial surfaces, lower uterine segment, endocervical canal all visualized. Large pedunculated polyp from left fundal region filling the cavity. Smaller sessile polyp right posterior endometrial surface. Both polyps resected to the level the surrounding endometrium. Endometrium otherwise appeared atrophic.  Procedure:  The patient was taken to the operating room, was placed in the low dorsal lithotomy position, underwent general anesthesia, received a perineal/vaginal preparation with Betadine solution per physician and the bladder was emptied with in and out Foley catheterization. The timeout was performed by the surgical team. An EUA was performed. The patient was draped in the usual fashion. The cervix was visualized with a speculum, anterior lip grasped with a single-tooth tenaculum and a paracervical block was placed using 10 cc's of 1% lidocaine. The cervix was gently dilated to admit the Myosure hysteroscope and hysteroscopy was performed with findings noted above. Using the Myosure  Reach resectoscopic wand the polyps were resected in their entirety to the level the surrounding endometrium. A gentle sharp curettage was performed. Both specimens were sent separately to pathology.  Repeat hysteroscopy showed an empty cavity with good distention and no evidence of perforation. Attention was then directed to the vaginal mass. Using the 12 x 15 mm LEEP resectoscopic wand the polypoid mass was excised at the level of the surrounding vaginal mucosa and sent to pathology. Monsel's solution was applied afterwards for ultimate hemostasis. Adequate hemostasis was visualized at the tenaculum site and external cervical os as well as the vaginal mass excision site. The patient was given intraoperative Toradol, was awakened without difficulty and was taken to the recovery room in good condition having tolerated the procedure well.     Anastasio Auerbach MD, 12:37 PM 07/09/2016

## 2016-07-09 NOTE — Anesthesia Postprocedure Evaluation (Signed)
Anesthesia Post Note  Patient: Mackenzie Nichols  Procedure(s) Performed: Procedure(s) (LRB): DILATATION & CURETTAGE/HYSTEROSCOPY WITH MYOSURE ([name]) EXCISION VAGINAL MASS ([name])  Patient location during evaluation: PACU Anesthesia Type: General Level of consciousness: awake Pain management: pain level controlled Vital Signs Assessment: post-procedure vital signs reviewed and stable Cardiovascular status: stable Postop Assessment: no signs of nausea or vomiting Anesthetic complications: no     Last Vitals:  Vitals:   07/09/16 1330 07/09/16 1345  BP: 127/70 122/74  Pulse: (!) 54 (!) 54  Resp: (!) 25 (!) 21  Temp:      Last Pain:  Vitals:   07/09/16 1022  TempSrc: Oral   Pain Goal: Patients Stated Pain Goal: 0 (07/09/16 1222)               [place]

## 2016-07-09 NOTE — Anesthesia Procedure Notes (Signed)
Procedure Name: LMA Insertion Date/Time: 07/09/2016 11:36 AM Performed by: Bufford Spikes Pre-anesthesia Checklist: Patient identified, Emergency Drugs available, Suction available, Patient being monitored and Timeout performed Patient Re-evaluated:Patient Re-evaluated prior to inductionOxygen Delivery Method: Circle system utilized Preoxygenation: Pre-oxygenation with 100% oxygen Intubation Type: IV induction LMA: LMA inserted LMA Size: 4.0 Tube type: Oral Number of attempts: 1 Tube secured with: Tape Dental Injury: Teeth and Oropharynx as per pre-operative assessment

## 2016-07-09 NOTE — H&P (Signed)
  The patient was examined.  I reviewed the proposed surgery and consent form with the patient.  The dictated history and physical is current and accurate and all questions were answered. The patient is ready to proceed with surgery and has a realistic understanding and expectation for the outcome.   Anastasio Auerbach MD, 10:50 AM 07/09/2016

## 2016-07-09 NOTE — Anesthesia Preprocedure Evaluation (Addendum)
Anesthesia Evaluation  Patient identified by MRN, date of birth, ID band Patient awake    Reviewed: Allergy & Precautions, NPO status , Patient's Chart, lab work & pertinent test results  History of Anesthesia Complications Negative for: history of anesthetic complications  Airway Mallampati: II  TM Distance: >3 FB Neck ROM: Full    Dental  (+) Teeth Intact, Dental Advisory Given, Partial Upper, Chipped,    Pulmonary neg pulmonary ROS,    Pulmonary exam normal breath sounds clear to auscultation       Cardiovascular hypertension, Pt. on medications Normal cardiovascular exam Rhythm:Regular Rate:Normal     Neuro/Psych negative neurological ROS  negative psych ROS   GI/Hepatic negative GI ROS, Neg liver ROS,   Endo/Other  diabetes, Type 2Morbid obesity  Renal/GU negative Renal ROS     Musculoskeletal negative musculoskeletal ROS (+)   Abdominal   Peds  Hematology negative hematology ROS (+)   Anesthesia Other Findings Day of surgery medications reviewed with the patient.  Reproductive/Obstetrics Fibroid AUB                            Anesthesia Physical Anesthesia Plan  ASA: III  Anesthesia Plan: General   Post-op Pain Management:    Induction: Intravenous  Airway Management Planned: LMA  Additional Equipment:   Intra-op Plan:   Post-operative Plan: Extubation in OR  Informed Consent: I have reviewed the patients History and Physical, chart, labs and discussed the procedure including the risks, benefits and alternatives for the proposed anesthesia with the patient or authorized representative who has indicated his/her understanding and acceptance.   Dental advisory given  Plan Discussed with: CRNA  Anesthesia Plan Comments: (Risks/benefits of general anesthesia discussed with patient including risk of damage to teeth, lips, gum, and tongue, nausea/vomiting, allergic  reactions to medications, and the possibility of heart attack, stroke and death.  All patient questions answered.  Patient wishes to proceed.)        Anesthesia Quick Evaluation

## 2016-07-09 NOTE — Transfer of Care (Signed)
Immediate Anesthesia Transfer of Care Note  Patient: Mackenzie Nichols  Procedure(s) Performed: Procedure(s) with comments: [place] ([name]) - request 7:30am start time  needs one hour  Needs LEEP machine available. EXCISION VAGINAL MASS ([name])  Patient Location: PACU  Anesthesia Type:General  Level of Consciousness: awake and alert   Airway & Oxygen Therapy: Patient Spontanous Breathing and Patient connected to nasal cannula oxygen  Post-op Assessment: Report given to RN and Post -op Vital signs reviewed and stable  Post vital signs: Reviewed and stable  Last Vitals:  Vitals:   07/09/16 1023 07/09/16 1222  BP: 126/70 124/64  Pulse:  73  Resp:  16  Temp:  36.5 C    Last Pain:  Vitals:   07/09/16 1022  TempSrc: Oral      Patients Stated Pain Goal: 3 ([id number])  Complications: No apparent anesthesia complications

## 2016-07-09 NOTE — Discharge Instructions (Signed)
°  Post Anesthesia Home Care Instructions ° °Activity: °Get plenty of rest for the remainder of the day. A responsible adult should stay with you for 24 hours following the procedure.  °For the next 24 hours, DO NOT: °-Drive a car °-Operate machinery °-Drink alcoholic beverages °-Take any medication unless instructed by your physician °-Make any legal decisions or sign important papers. ° °Meals: °Start with liquid foods such as gelatin or soup. Progress to regular foods as tolerated. Avoid greasy, spicy, heavy foods. If nausea and/or vomiting occur, drink only clear liquids until the nausea and/or vomiting subsides. Call your physician if vomiting continues. ° °Special Instructions/Symptoms: °Your throat may feel dry or sore from the anesthesia or the breathing tube placed in your throat during surgery. If this causes discomfort, gargle with warm salt water. The discomfort should disappear within 24 hours. ° °If you had a scopolamine patch placed behind your ear for the management of post- operative nausea and/or vomiting: ° °1. The medication in the patch is effective for 72 hours, after which it should be removed.  Wrap patch in a tissue and discard in the trash. Wash hands thoroughly with soap and water. °2. You may remove the patch earlier than 72 hours if you experience unpleasant side effects which may include dry mouth, dizziness or visual disturbances. °3. Avoid touching the patch. Wash your hands with soap and water after contact with the patch. °  ° ° °Postoperative Instructions Hysteroscopy D & C ° °Dr. Fontaine and the nursing staff have discussed postoperative instructions with you.  If you have any questions please ask them before you leave the hospital, or call Dr Fontaine’s office at [phone].   ° °We would like to emphasize the following instructions: ° ° °? Call the office to make your follow-up appointment as recommended by Dr Fontaine (usually 1-2 weeks). ° °? You were given a prescription,  or one was ordered for you at the pharmacy you designated.  Get that prescription filled and take the medication according to instructions. ° °? You may eat a regular diet, but slowly until you start having bowel movements. ° °? Drink plenty of water daily. ° °? Nothing in the vagina (intercourse, douching, objects of any kind) for two weeks.  When reinitiating intercourse, if it is uncomfortable, stop and make an appointment with Dr Fontaine to be evaluated. ° °? No driving for one to two days until the effects of anesthesia has worn off.  No traveling out of town for several days. ° °? You may shower, but no baths for one week.  Walking up and down stairs is ok.  No heavy lifting, prolonged standing, repeated bending or any “working out” until your post op check. ° °? Rest frequently, listen to your body and do not push yourself and overdo it. ° °? Call if: ° °o Your pain medication does not seem strong enough. °o Worsening pain or abdominal bloating °o Persistent nausea or vomiting °o Difficulty with urination or bowel movements. °o Temperature of 101 degrees or higher. °o Heavy vaginal bleeding.  If your period is due, you may use tampons. °o You have any questions or concerns ° ° ° ° °

## 2016-07-15 ENCOUNTER — Encounter (HOSPITAL_COMMUNITY): Payer: Self-pay | Admitting: Gynecology

## 2016-07-24 ENCOUNTER — Ambulatory Visit (INDEPENDENT_AMBULATORY_CARE_PROVIDER_SITE_OTHER): Payer: 59 | Admitting: Gynecology

## 2016-07-24 ENCOUNTER — Encounter: Payer: Self-pay | Admitting: Gynecology

## 2016-07-24 VITALS — BP 124/80

## 2016-07-24 DIAGNOSIS — Z9889 Other specified postprocedural states: Secondary | ICD-10-CM

## 2016-07-24 NOTE — Progress Notes (Signed)
    Mackenzie Nichols 02-22-1956 [id number]        60 y.o.  [id number] presents for her postoperative visit status post hysteroscopy D&C resection of endometrial polyp and excision of vaginal mass.  Has done well without complaints  Past medical history,surgical history, problem list, medications, allergies, family history and social history were all reviewed and documented in the EPIC chart.  Directed ROS with pertinent positives and negatives documented in the history of present illness/assessment and plan.  Exam: Caryn Bee assistant Vitals:   07/24/16 0752  BP: 124/80   General appearance:  Normal Abdomen soft nontender without masses guarding rebound Pelvic external BUS vagina with atrophic changes. Cervix normal. Uterus bulky consistent with her history of leiomyoma. Adnexa without gross masses or tenderness  Assessment/Plan:  60 y.o. [id number] with normal postoperative visit. Reviewed benign pathology with her and pictures from the surgery. Patient will follow up when she is due for her annual exam, sooner if any issues.    Anastasio Auerbach MD, 8:08 AM 07/24/2016

## 2016-07-24 NOTE — Patient Instructions (Signed)
Follow up when you're due for your annual exam. 

## 2016-09-05 ENCOUNTER — Emergency Department (HOSPITAL_COMMUNITY): Payer: 59

## 2016-09-05 ENCOUNTER — Emergency Department (HOSPITAL_COMMUNITY)
Admission: EM | Admit: 2016-09-05 | Discharge: 2016-09-05 | Disposition: A | Discharge status: Home / Self Care | Payer: 59 | Attending: Emergency Medicine | Admitting: Emergency Medicine

## 2016-09-05 ENCOUNTER — Encounter (HOSPITAL_COMMUNITY): Payer: Self-pay | Admitting: Emergency Medicine

## 2016-09-05 DIAGNOSIS — I10 Essential (primary) hypertension: Secondary | ICD-10-CM | POA: Diagnosis not present

## 2016-09-05 DIAGNOSIS — E119 Type 2 diabetes mellitus without complications: Secondary | ICD-10-CM | POA: Diagnosis not present

## 2016-09-05 DIAGNOSIS — M25512 Pain in left shoulder: Secondary | ICD-10-CM

## 2016-09-05 DIAGNOSIS — Y9289 Other specified places as the place of occurrence of the external cause: Secondary | ICD-10-CM | POA: Diagnosis not present

## 2016-09-05 DIAGNOSIS — X500XXA Overexertion from strenuous movement or load, initial encounter: Secondary | ICD-10-CM | POA: Diagnosis not present

## 2016-09-05 DIAGNOSIS — Y9389 Activity, other specified: Secondary | ICD-10-CM | POA: Diagnosis not present

## 2016-09-05 DIAGNOSIS — Y990 Civilian activity done for income or pay: Secondary | ICD-10-CM | POA: Insufficient documentation

## 2016-09-05 MED ORDER — IBUPROFEN 600 MG PO TABS: 600.0000 mg | ORAL_TABLET | Freq: Four times a day (QID) | ORAL | 0 refills | Status: DC | PRN

## 2016-09-05 NOTE — ED Provider Notes (Signed)
[facility] DEPT Provider Note   CSN: [id number] Arrival date & time: 09/05/16  [id number]  By signing my name below, I, Rayna Sexton, attest that this documentation has been prepared under the direction and in the presence of [facility], PA-C. Electronically Signed: Rayna Sexton, ED Scribe. 09/05/16. 9:06 AM.    History   Chief Complaint Chief Complaint  Patient presents with  . Shoulder Injury    HPI HPI Comments: Mackenzie Nichols is a 60 y.o. female who is right hand dominant presents to the Emergency Department complaining of gradual onset, worsening, moderate, left shoulder pain x 3 weeks. 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 Her pain worsens with ROM of her left shoulder and radiates down her LUE. Pt denies having taken any medications for her symptoms stating "I don't like taking medicine". Pt has been evaluated by an orthopaedist in the past for prior right shoulder pain but is unsure of who she was seen. She denies back pain, numbness, tingling, weakness or other associated symptoms at this time.   The history is provided by the patient. No language interpreter was used.    Past Medical History:  Diagnosis Date  . Borderline diabetes no meds  . Diabetes mellitus, type 2 ([place])   . Fibroid   . Hyperlipidemia   . Hypertension   . Obesity     Patient Active Problem List   Diagnosis Date Noted  . Bed bug bite 01/30/2016  . Postmenopausal bleeding 09/21/2015  . Vaginal neoplasm benign 09/21/2015  . Right shoulder pain 09/16/2014  . Bloody stools 08/09/2014  . Fatigue 07/15/2014  . Essential hypertension, benign 07/15/2014  . Diabetes mellitus, type 2 ([place]) 07/15/2014  . Fibroid uterus 09/01/2013  . Abnormal uterine bleeding 06/16/2013  . Hyperglycemia 06/16/2013  . Hypercholesterolemia 06/16/2013  . Morbid obesity with BMI of 40.0-44.9, adult ([place]) 09/17/2012    Past Surgical  History:  Procedure Laterality Date  . DILATATION & CURETTAGE/HYSTEROSCOPY WITH MYOSURE [name] 07/09/2016   Procedure: DILATATION & CURETTAGE/HYSTEROSCOPY WITH MYOSURE;  Surgeon: Anastasio Auerbach, MD;  Location: [facility];  Service: Gynecology;  Laterality: [name];  request 7:30am start time  needs one hour  Needs LEEP machine available.  Marland Kitchen EXCISION VAGINAL CYST [name] 07/09/2016   Procedure: EXCISION VAGINAL MASS;  Surgeon: Anastasio Auerbach, MD;  Location: [facility];  Service: Gynecology;  Laterality: [name];  . HERNIA REPAIR    . WISDOM TOOTH EXTRACTION      OB History    Gravida Para Term Preterm AB Living   7 6 6   1 6    SAB TAB Ectopic Multiple Live Births   1               Home Medications    Prior to Admission medications   Medication Sig Start Date End Date Taking? Authorizing Provider  amLODipine (NORVASC) 5 MG tablet TAKE 1 TABLET BY MOUTH EVERY DAY 02/09/16   Ashly Windell Moulding, DO  ibuprofen (ADVIL,MOTRIN) 600 MG tablet Take 1 tablet (600 mg total) by mouth every 6 (six) hours as needed. 09/05/16   [place], PA-C    Family History Family History  Problem Relation Age of Onset  . Diabetes Mother   . Hypertension Mother   . Diabetes Father   . Heart disease Father   . Prostate cancer Father   . Hypertension Father   . Diabetes Sister   .  Hypertension Sister   . Hypertension Sister   . Colon cancer Neg Hx   . Colon polyps Neg Hx   . Kidney disease Neg Hx   . Esophageal cancer Neg Hx   . Gallbladder disease Neg Hx     Social History Social History  Substance Use Topics  . Smoking status: Never Smoker  . Smokeless tobacco: Never Used  . Alcohol use No     Allergies   Clindamycin/lincomycin   Review of Systems Review of Systems  Musculoskeletal: Positive for arthralgias.  Skin: Negative for color change and wound.  Neurological: Negative for weakness and numbness.    Physical Exam Updated Vital Signs BP 138/81 (BP Location: Left Arm)   Pulse 81    Temp 98.2 F (36.8 C) (Oral)   Resp 20   Ht 5\' 2"  (1.575 m)   Wt 106.6 kg   LMP 06/15/2013   SpO2 98%   BMI 42.98 kg/m   Physical Exam  Constitutional: She is oriented to person, place, and time. She appears well-developed and well-nourished. No distress.  HENT:  Head: Normocephalic and atraumatic.  Cardiovascular: Normal rate, regular rhythm, normal heart sounds and intact distal pulses.  Exam reveals no gallop and no friction rub.   No murmur heard. Pulmonary/Chest: Effort normal and breath sounds normal. No respiratory distress. She has no wheezes. She has no rales. She exhibits no tenderness.  Musculoskeletal: She exhibits no edema.  No C, T or L-spine tenderness  Left shoulder : TTP of anterior shoulder. Slightly decreased ROM 2/2. Pain with empty can test but full strength, + Neer's. No swelling, erythema, or ecchymosis present. No step-off, crepitus, or deformity appreciated. 5/5 muscle strength of BUE. 2+ radial pulse, sensation intact, all compartments soft.   Neurological: She is alert and oriented to person, place, and time. No sensory deficit.  Skin: Skin is warm and dry.  Nursing note and vitals reviewed.  ED Treatments / Results  Labs (all labs ordered are listed, but only abnormal results are displayed) Labs Reviewed - No data to display  EKG  EKG Interpretation None       Radiology Dg Shoulder Left  Result Date: 09/05/2016 CLINICAL DATA:  Left shoulder pain.  Injury 3 weeks ago EXAM: LEFT SHOULDER - 2+ VIEW COMPARISON:  none FINDINGS: Mild degenerative change in the glenohumeral joint with widening of the joint space and mild spurring. AC joint intact. Negative for fracture. No acute skeletal abnormality. IMPRESSION: Mild degenerative change in the glenohumeral joint. No acute abnormality. Electronically Signed   By: Franchot Gallo M.D.   On: 09/05/2016 08:14    Procedures Procedures  DIAGNOSTIC STUDIES: Oxygen Saturation is 98% on RA, normal by my  interpretation.    COORDINATION OF CARE: 9:05 AM Discussed next steps with pt. Pt verbalized understanding and is agreeable with the plan.    Medications Ordered in ED Medications - No data to display   Initial Impression / Assessment and Plan / ED Course  I have reviewed the triage vital signs and the nursing notes.  Pertinent labs & imaging results that were available during my care of the patient were reviewed by me and considered in my medical decision making (see chart for details).  Clinical Course   Mackenzie Nichols presents to ED for worsening shoulder pain x 3 weeks.  Patient X-Ray negative for obvious fracture or dislocation.  Pt advised to follow up with orthopedics. Conservative therapy recommended and discussed. Patient will be discharged home & is  agreeable with above plan. Returns precautions discussed. Pt appears safe for discharge.  I personally performed the services described in this documentation, which was scribed in my presence. The recorded information has been reviewed and is accurate.  Final Clinical Impressions(s) / ED Diagnoses   Final diagnoses:  Left shoulder pain    New Prescriptions Discharge Medication List as of 09/05/2016  9:13 AM    START taking these medications   Details  ibuprofen (ADVIL,MOTRIN) 600 MG tablet Take 1 tablet (600 mg total) by mouth every 6 (six) hours as needed., Starting Thu 09/05/2016, Print         [employer] Keithon Mccoin, PA-C 09/05/16 1104    Isla Pence, MD 09/05/16 1313

## 2016-09-05 NOTE — Discharge Instructions (Signed)
You have an overuse injury. This has persistently worsened over the last 3 weeks as you have continued to lift objects and move the shoulder.  Take ibuprofen as needed for pain / swelling. Ice affected area for additional pain relief.  If no improvement by Monday, please call the orthopedic physician listed or the orthopedic doctor you have seen in the past.  Return to ER for new or worsening symptoms, any additional concerns.

## 2016-09-05 NOTE — ED Notes (Signed)
[id number] COUPON GIVEN

## 2016-09-05 NOTE — ED Triage Notes (Signed)
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

## 2017-02-21 ENCOUNTER — Encounter: Payer: Self-pay | Admitting: Family Medicine

## 2017-02-21 ENCOUNTER — Other Ambulatory Visit (HOSPITAL_COMMUNITY)
Admission: RE | Admit: 2017-02-21 | Discharge: 2017-02-21 | Disposition: A | Discharge status: Home / Self Care | Payer: Self-pay | Source: Ambulatory Visit | Attending: Family Medicine | Admitting: Family Medicine

## 2017-02-21 ENCOUNTER — Ambulatory Visit (INDEPENDENT_AMBULATORY_CARE_PROVIDER_SITE_OTHER): Payer: Self-pay | Admitting: Family Medicine

## 2017-02-21 VITALS — BP 124/72 | HR 82 | Temp 97.9°F | Ht 62.0 in | Wt 222.8 lb

## 2017-02-21 DIAGNOSIS — Z0000 Encounter for general adult medical examination without abnormal findings: Secondary | ICD-10-CM

## 2017-02-21 DIAGNOSIS — Z202 Contact with and (suspected) exposure to infections with a predominantly sexual mode of transmission: Secondary | ICD-10-CM

## 2017-02-21 DIAGNOSIS — Z6841 Body Mass Index (BMI) 40.0 and over, adult: Secondary | ICD-10-CM

## 2017-02-21 DIAGNOSIS — E119 Type 2 diabetes mellitus without complications: Secondary | ICD-10-CM

## 2017-02-21 DIAGNOSIS — I10 Essential (primary) hypertension: Secondary | ICD-10-CM

## 2017-02-21 DIAGNOSIS — Z113 Encounter for screening for infections with a predominantly sexual mode of transmission: Secondary | ICD-10-CM | POA: Insufficient documentation

## 2017-02-21 DIAGNOSIS — Z1159 Encounter for screening for other viral diseases: Secondary | ICD-10-CM

## 2017-02-21 DIAGNOSIS — E7800 Pure hypercholesterolemia, unspecified: Secondary | ICD-10-CM

## 2017-02-21 DIAGNOSIS — R569 Unspecified convulsions: Secondary | ICD-10-CM

## 2017-02-21 LAB — COMPLETE METABOLIC PANEL WITH GFR
ALBUMIN: 4 g/dL (ref 3.6–5.1)
ALK PHOS: 46 U/L (ref 33–130)
ALT: 12 U/L (ref 6–29)
AST: 16 U/L (ref 10–35)
BILIRUBIN TOTAL: 0.5 mg/dL (ref 0.2–1.2)
BUN: 8 mg/dL (ref 7–25)
CALCIUM: 9.5 mg/dL (ref 8.6–10.4)
CO2: 26 mmol/L (ref 20–31)
CREATININE: 0.89 mg/dL (ref 0.50–1.05)
Chloride: 104 mmol/L (ref 98–110)
GFR, Est African American: 82 mL/min (ref >=60)
GFR, Est Non African American: 71 mL/min (ref >=60)
Glucose, Bld: 156 mg/dL — ABNORMAL HIGH (ref 65–99)
Potassium: 3.9 mmol/L (ref 3.5–5.3)
SODIUM: 141 mmol/L (ref 135–146)
TOTAL PROTEIN: 7.5 g/dL (ref 6.1–8.1)

## 2017-02-21 LAB — LIPID PANEL
CHOLESTEROL: 213 mg/dL — AB (ref <200)
HDL: 54 mg/dL (ref >50)
LDL Cholesterol: 134 mg/dL — ABNORMAL HIGH (ref <100)
Total CHOL/HDL Ratio: 3.9 Ratio (ref <5.0)
Triglycerides: 123 mg/dL (ref <150)
VLDL: 25 mg/dL (ref <30)

## 2017-02-21 LAB — HEPATITIS C ANTIBODY: HCV Ab: NEGATIVE

## 2017-02-21 LAB — HIV ANTIBODY (ROUTINE TESTING W REFLEX): HIV 1&2 Ab, 4th Generation: NONREACTIVE

## 2017-02-21 MED ORDER — ATORVASTATIN CALCIUM 40 MG PO TABS: 40.0000 mg | ORAL_TABLET | Freq: Every day | ORAL | 2 refills | Status: DC

## 2017-02-21 MED ORDER — METFORMIN HCL ER 500 MG PO TB24: 500.0000 mg | ORAL_TABLET | Freq: Two times a day (BID) | ORAL | 0 refills | Status: DC

## 2017-02-21 NOTE — Progress Notes (Signed)
Mackenzie Nichols is a 61 y.o. female presents to office today for annual physical exam examination.  Concerns today include:  1. ?Seizure like activity Patient reports that she has intermittent episodes, that she notices most when she goes to lie down of feeling like her "brain is shaking".  She goes on to say that she feels aware of her surroundings but that she is paralyzed and cannot call out.  She denies bowel or bladder incontinence.  She reports two of her children have epilepsy.  She would like to see a neurologist for this.  2. Diabetes:  High at home: 190s Patient is not taking any medications.  She has been working on weight loss.  She is interested in proceeding with medications.  She denies numbness, tingling, visual disturbance, polyuria, polydipsia.  3. Possible STI Patient reports that her partner has been unfaithful.  She would like to be tested for STIs.  She denies abnormal vaginal discharge, fevers, abdominal pain, genital lesions, sore throat.  Last eye exam: 2017; [place] Last dental exam: >1 year Last colonoscopy: 01/17/2015, normal.  10 year follow up Last mammogram: 2015, [place] 1 Last pap smear: 2017, negative. Immunizations needed:  Flu Vaccine: yes  Pneumonia Vaccine: yes  Past Medical History:  Diagnosis Date  . Borderline diabetes no meds  . Diabetes mellitus, type 2 ([place])   . Fibroid   . Hyperlipidemia   . Hypertension   . Obesity    Social History   Social History  . Marital status: Married    Spouse name: [name]  . Number of children: 6  . Years of education: [name]   Occupational History  . CNA    Social History Main Topics  . Smoking status: Never Smoker  . Smokeless tobacco: Never Used  . Alcohol use No  . Drug use: No  . Sexual activity: Not Currently     Comment: 1st intercourse 61 yo-Fewer than 5 partners   Other Topics Concern  . Not on file   Social History Narrative  . No narrative on file   Past Surgical History:    Procedure Laterality Date  . DILATATION & CURETTAGE/HYSTEROSCOPY WITH MYOSURE [name] 07/09/2016   Procedure: DILATATION & CURETTAGE/HYSTEROSCOPY WITH MYOSURE;  Surgeon: Anastasio Auerbach, MD;  Location: [facility];  Service: Gynecology;  Laterality: [name];  request 7:30am start time  needs one hour  Needs LEEP machine available.  Marland Kitchen EXCISION VAGINAL CYST [name] 07/09/2016   Procedure: EXCISION VAGINAL MASS;  Surgeon: Anastasio Auerbach, MD;  Location: [facility];  Service: Gynecology;  Laterality: [name];  . HERNIA REPAIR    . WISDOM TOOTH EXTRACTION     Family History  Problem Relation Age of Onset  . Diabetes Mother   . Hypertension Mother   . Diabetes Father   . Heart disease Father   . Prostate cancer Father   . Hypertension Father   . Diabetes Sister   . Hypertension Sister   . Kidney disease Sister   . Epilepsy Daughter   . Hyperthyroidism Daughter   . Epilepsy Son   . Hypertension Sister   . Cancer Sister   . Hypertension Sister   . Colon cancer Neg Hx   . Colon polyps Neg Hx   . Esophageal cancer Neg Hx   . Gallbladder disease Neg Hx      ROS: Review of Systems Constitutional: negative Eyes: positive for contacts/glasses Ears, nose, mouth, throat, and face: negative Respiratory: negative Cardiovascular: negative Gastrointestinal: negative Genitourinary:negative Integument/breast:  negative Hematologic/lymphatic: negative Musculoskeletal:negative Neurological: positive for ?seizure like activity Behavioral/Psych: negative Endocrine: negative Allergic/Immunologic: negative    Physical exam BP 124/72   Pulse 82   Temp 97.9 F (36.6 C) (Oral)   Ht 5\' 2"  (1.575 m)   Wt 222 lb 12.8 oz (101.1 kg)   LMP 06/15/2013   SpO2 98%   BMI 40.75 kg/m  General appearance: alert, cooperative, appears stated age and no distress Head: Normocephalic, without obvious abnormality, atraumatic Eyes: negative findings: lids and lashes normal, conjunctivae and sclerae normal, corneas clear  and pupils equal, round, reactive to light and accomodation Ears: normal TM's and external ear canals both ears Nose: Nares normal. Septum midline. Mucosa normal. No drainage or sinus tenderness. Throat: lips, mucosa, and tongue normal; teeth and gums normal Neck: no adenopathy, supple, symmetrical, trachea midline and thyroid not enlarged, symmetric, no tenderness/mass/nodules Back: symmetric, no curvature. ROM normal. No CVA tenderness. Lungs: clear to auscultation bilaterally Heart: regular rate and rhythm, S1, S2 normal, no murmur, click, rub or gallop Abdomen: soft, non-tender; bowel sounds normal; no masses,  no organomegaly Extremities: extremities normal, atraumatic, no cyanosis or edema Pulses: 2+ and symmetric Skin: Skin color, texture, turgor normal. No rashes or lesions Lymph nodes: Cervical, supraclavicular, and axillary nodes normal. Neurologic: Grossly normal    Assessment/ Plan: Abran Cantor here for annual physical exam.   1. Annual physical exam - Fam, Med, Surg histories updated and reviewed - Mammogram information provided and recommended  2. Essential hypertension, benign, controlled. - Lipid panel - COMPLETE METABOLIC PANEL WITH GFR  3. Type 2 diabetes mellitus without complication, unspecified long term insulin use status (HCC) - Lipid panel - COMPLETE METABOLIC PANEL WITH GFR - metFORMIN (GLUCOPHAGE XR) 500 MG 24 hr tablet; Take 1 tablet (500 mg total) by mouth 2 (two) times daily.  Dispense: 60 tablet; Refill: 0 - atorvastatin (LIPITOR) 40 MG tablet; Take 1 tablet (40 mg total) by mouth daily.  Dispense: 30 tablet; Refill: 2 - Microalbumin/Creatinine Ratio, Urine - Obtain A1c at next visit  4. Hypercholesterolemia - Lipid panel  5. Morbid obesity with BMI of 40.0-44.9, adult (HCC) - Lipid panel - COMPLETE METABOLIC PANEL WITH GFR  6. Need for hepatitis C screening test - Hepatitis C antibody  7. Possible exposure to STD - HIV antibody -  RPR - Urine cytology ancillary only  8. Seizure-like activity ([place]) - Ambulatory referral to Neurology  Follow up in 4 weeks for DM2  [name], DO PGY-3, [facility]

## 2017-02-21 NOTE — Patient Instructions (Addendum)
I will contact you will the results of your labs.  If anything is abnormal, I will call you.  Otherwise, expect a copy to be mailed to you.  I have prescribed Metformin 500mg  to take for your diabetes.  I recommend that you start with 1 tablet daily for 2 weeks then increase to 1 tablet twice daily thereafter.  I have sent in Atorvastatin.  This medication is for cholesterol.  It is recommended in ALL diabetics to help reduce risk of stroke and heart attack.  I also recommend that you start taking a baby aspirin (81mg ) daily to reduce your risk of stroke and hear attack.  Follow up with me in 1 month for diabetes. Diabetes Mellitus and Food It is important for you to manage your blood sugar (glucose) level. Your blood glucose level can be greatly affected by what you eat. Eating healthier foods in the appropriate amounts throughout the day at about the same time each day will help you control your blood glucose level. It can also help slow or prevent worsening of your diabetes mellitus. Healthy eating may even help you improve the level of your blood pressure and reach or maintain a healthy weight. General recommendations for healthful eating and cooking habits include:  Eating meals and snacks regularly. Avoid going long periods of time without eating to lose weight.  Eating a diet that consists mainly of plant-based foods, such as fruits, vegetables, nuts, legumes, and whole grains.  Using low-heat cooking methods, such as baking, instead of high-heat cooking methods, such as deep frying. Work with your dietitian to make sure you understand how to use the Nutrition Facts information on food labels. How can food affect me? Carbohydrates  Carbohydrates affect your blood glucose level more than any other type of food. Your dietitian will help you determine how many carbohydrates to eat at each meal and teach you how to count carbohydrates. Counting carbohydrates is important to keep your blood glucose  at a healthy level, especially if you are using insulin or taking certain medicines for diabetes mellitus. Alcohol  Alcohol can cause sudden decreases in blood glucose (hypoglycemia), especially if you use insulin or take certain medicines for diabetes mellitus. Hypoglycemia can be a life-threatening condition. Symptoms of hypoglycemia (sleepiness, dizziness, and disorientation) are similar to symptoms of having too much alcohol. If your health care provider has given you approval to drink alcohol, do so in moderation and use the following guidelines:  Women should not have more than one drink per day, and men should not have more than two drinks per day. One drink is equal to:  12 oz of beer.  5 oz of wine.  1 oz of hard liquor.  Do not drink on an empty stomach.  Keep yourself hydrated. Have water, diet soda, or unsweetened iced tea.  Regular soda, juice, and other mixers might contain a lot of carbohydrates and should be counted. What foods are not recommended? As you make food choices, it is important to remember that all foods are not the same. Some foods have fewer nutrients per serving than other foods, even though they might have the same number of calories or carbohydrates. It is difficult to get your body what it needs when you eat foods with fewer nutrients. Examples of foods that you should avoid that are high in calories and carbohydrates but low in nutrients include:  Trans fats (most processed foods list trans fats on the Nutrition Facts label).  Regular soda.  Juice.  Candy.  Sweets, such as cake, pie, doughnuts, and cookies.  Fried foods. What foods can I eat? Eat nutrient-rich foods, which will nourish your body and keep you healthy. The food you should eat also will depend on several factors, including:  The calories you need.  The medicines you take.  Your weight.  Your blood glucose level.  Your blood pressure level.  Your cholesterol level. You  should eat a variety of foods, including:  Protein.  Lean cuts of meat.  Proteins low in saturated fats, such as fish, egg whites, and beans. Avoid processed meats.  Fruits and vegetables.  Fruits and vegetables that may help control blood glucose levels, such as apples, mangoes, and yams.  Dairy products.  Choose fat-free or low-fat dairy products, such as milk, yogurt, and cheese.  Grains, bread, pasta, and rice.  Choose whole grain products, such as multigrain bread, whole oats, and brown rice. These foods may help control blood pressure.  Fats.  Foods containing healthful fats, such as nuts, avocado, olive oil, canola oil, and fish. Does everyone with diabetes mellitus have the same meal plan? Because every person with diabetes mellitus is different, there is not one meal plan that works for everyone. It is very important that you meet with a dietitian who will help you create a meal plan that is just right for you. This information is not intended to replace advice given to you by your health care provider. Make sure you discuss any questions you have with your health care provider. Document Released: 08/29/2005 Document Revised: 05/09/2016 Document Reviewed: 10/29/2013 Elsevier Interactive Patient Education  2017 [employer].

## 2017-02-22 LAB — RPR

## 2017-02-24 LAB — URINE CYTOLOGY ANCILLARY ONLY
CHLAMYDIA, DNA PROBE: NEGATIVE
NEISSERIA GONORRHEA: NEGATIVE
Trichomonas: NEGATIVE

## 2017-02-26 ENCOUNTER — Encounter: Payer: Self-pay | Admitting: Family Medicine

## 2017-03-03 ENCOUNTER — Other Ambulatory Visit: Payer: Self-pay | Admitting: Family Medicine

## 2017-04-09 ENCOUNTER — Other Ambulatory Visit: Payer: Self-pay | Admitting: Family Medicine

## 2017-04-09 DIAGNOSIS — E119 Type 2 diabetes mellitus without complications: Secondary | ICD-10-CM

## 2018-03-18 ENCOUNTER — Other Ambulatory Visit: Payer: Self-pay | Admitting: Family Medicine

## 2018-04-01 ENCOUNTER — Other Ambulatory Visit: Payer: Self-pay | Admitting: Family Medicine

## 2018-04-02 ENCOUNTER — Other Ambulatory Visit: Payer: Self-pay | Admitting: Family Medicine

## 2018-04-09 ENCOUNTER — Encounter: Payer: Self-pay | Admitting: Family Medicine

## 2018-04-09 NOTE — Progress Notes (Deleted)
Subjective:  Mackenzie Nichols is a 62 y.o. year old female who presents to office today for an annual physical examination.  Concerns today include:  1. ***   Review of Systems {ros; complete:30496}   Review of Systems: Per HPI. All other systems reviewed and are negative.  General Healthcare: Medication Compliance: *** Dx Hypertension: *** Dx Hyperlipidemia: *** Diabetes: *** Dx Obesity: *** Weight Loss: *** Physical Activity: *** Urinary Incontinence: ***  Menstrual hx: *** Last dental exam: ***  Social:  reports that she has never smoked. She has never used smokeless tobacco. Driving: *** Alcohol Use: *** Tobacco ***  Other Drugs: ***  Support and Life at Home: *** Advanced Directives: *** Work: ***  Cancer:  Colorectal >> Colonoscopy: *** Lung >> Tobacco Use: ***              - If so, previous Low-Dose CT screen: *** Breast >> Mammogram: *** Cervical/Endometrial >>  - Postmenopausal: *** - Hysterectomy: *** - Vaginal Bleeding: *** Skin >> Suspicious lesions: ***  Other: Osteoporosis: *** TDAP: *** every 71yrs - (<3 lifetime doses or unknown): all wounds -- look up need for Tetanus IG - (>=3 lifetime doses): clean/minor wound if >58yrs from previous; all other wounds if >55yrs from previous Zoster Vaccine: *** (those >50yo, once) Pneumonia Vaccine: *** (those w/ risk factors) - (<6yr) Both: Immunocompromised, cochlear implant, CSF leak, asplenic, sickle cell, Chronic Renal Failure - (<64yr) PPSV-23 only: Heart dz, lung disease, DM, tobacco abuse, alcoholism, cirrhosis/liver disease. - (>14yr): PPSV13 then PPSV23 in 6-12mths;  - (>78yr): repeat PPSV23 once if pt received prior to 60yo and 55yrs have passed   Health Maintenance Due  Topic Date Due  . PNEUMOCOCCAL POLYSACCHARIDE VACCINE (1) 01/26/1960  . FOOT EXAM  01/26/1968  . OPHTHALMOLOGY EXAM  01/26/1968  . URINE MICROALBUMIN  01/26/1968  . HEMOGLOBIN A1C  07/09/2016  . MAMMOGRAM  08/18/2016      Past Medical History Past Medical History:  Diagnosis Date  . Borderline diabetes no meds  . Diabetes mellitus, type 2 ([place])   . Fibroid   . Hyperlipidemia   . Hypertension   . Obesity    Patient Active Problem List   Diagnosis Date Noted  . Bed bug bite 01/30/2016  . Postmenopausal bleeding 09/21/2015  . Vaginal neoplasm benign 09/21/2015  . Right shoulder pain 09/16/2014  . Bloody stools 08/09/2014  . Fatigue 07/15/2014  . Essential hypertension, benign 07/15/2014  . Diabetes mellitus, type 2 ([place]) 07/15/2014  . Fibroid uterus 09/01/2013  . Abnormal uterine bleeding 06/16/2013  . Hyperglycemia 06/16/2013  . Hypercholesterolemia 06/16/2013  . Morbid obesity with BMI of 40.0-44.9, adult ([place]) 09/17/2012    Medications- reviewed and updated Current Outpatient Medications  Medication Sig Dispense Refill  . amLODipine (NORVASC) 5 MG tablet TAKE 1 TABLET BY MOUTH EVERY DAY 90 tablet 1  . atorvastatin (LIPITOR) 40 MG tablet TAKE 1 TABLET BY MOUTH DAILY 30 tablet 11  . ibuprofen (ADVIL,MOTRIN) 600 MG tablet Take 1 tablet (600 mg total) by mouth every 6 (six) hours as needed. 30 tablet 0  . metFORMIN (GLUCOPHAGE XR) 500 MG 24 hr tablet Take 1 tablet (500 mg total) by mouth 2 (two) times daily. 60 tablet 0   No current facility-administered medications for this visit.     Objective: LMP 06/15/2013  Gen: In no acute distress, alert, cooperative with exam, well groomed HEENT: NCAT, EOMI, PERRL CV: Regular rate and rhythm, normal S1/S2, no murmur Resp: Clear to auscultation  bilaterally, no wheezes, non-labored Abd: Soft, Non Tender, Non Distended, bowel sounds present, no guarding or organomegaly Ext: No edema, warm and well perfused Neuro: Alert and oriented, No gross deficits, normal gait Psych: Normal mood and affect   Assessment/Plan:  No problem-specific Assessment & Plan notes found for this encounter.   No orders of the defined types were placed in this  encounter.   No orders of the defined types were placed in this encounter.    Smitty Cords, MD [place], PGY-3

## 2018-04-15 ENCOUNTER — Encounter: Payer: Self-pay | Admitting: Family Medicine

## 2018-04-15 ENCOUNTER — Ambulatory Visit (INDEPENDENT_AMBULATORY_CARE_PROVIDER_SITE_OTHER): Payer: Managed Care, Other (non HMO) | Admitting: Family Medicine

## 2018-04-15 ENCOUNTER — Other Ambulatory Visit: Payer: Self-pay

## 2018-04-15 VITALS — BP 124/88 | HR 84 | Temp 98.2°F | Ht 62.0 in | Wt 227.6 lb

## 2018-04-15 DIAGNOSIS — Z0000 Encounter for general adult medical examination without abnormal findings: Secondary | ICD-10-CM

## 2018-04-15 DIAGNOSIS — Z1231 Encounter for screening mammogram for malignant neoplasm of breast: Secondary | ICD-10-CM | POA: Diagnosis not present

## 2018-04-15 DIAGNOSIS — Z794 Long term (current) use of insulin: Secondary | ICD-10-CM

## 2018-04-15 DIAGNOSIS — E1100 Type 2 diabetes mellitus with hyperosmolarity without nonketotic hyperglycemic-hyperosmolar coma (NKHHC): Secondary | ICD-10-CM | POA: Diagnosis not present

## 2018-04-15 DIAGNOSIS — E7800 Pure hypercholesterolemia, unspecified: Secondary | ICD-10-CM | POA: Diagnosis not present

## 2018-04-15 DIAGNOSIS — Z1239 Encounter for other screening for malignant neoplasm of breast: Secondary | ICD-10-CM

## 2018-04-15 DIAGNOSIS — E119 Type 2 diabetes mellitus without complications: Secondary | ICD-10-CM | POA: Diagnosis not present

## 2018-04-15 DIAGNOSIS — I10 Essential (primary) hypertension: Secondary | ICD-10-CM

## 2018-04-15 LAB — POCT GLYCOSYLATED HEMOGLOBIN (HGB A1C): HEMOGLOBIN A1C: 7.3

## 2018-04-15 MED ORDER — ATORVASTATIN CALCIUM 40 MG PO TABS: 40.0000 mg | ORAL_TABLET | Freq: Every day | ORAL | 3 refills | Status: DC

## 2018-04-15 NOTE — Patient Instructions (Signed)
Thank you for coming in today, it was so nice to see you! Today we talked about:    Blood pressure: As long as your blood pressure is under 140/90, then you don't need to take the amlodipine, if it is higher than that please resume the amlodipine  Diabetes: We checked your A1C today, it is 7.3. Your goal is for your A1C to be under 7. We will need to check this in 3 months  Someone should call you for your mammogram and eye doctor appt  Please follow up in 3 months.    Bring in all your medications or supplements to each appointment for review.   If we ordered any tests today, you will be notified via telephone of any abnormalities. If everything is normal you will get a letter in the mail.   If you have any questions or concerns, please do not hesitate to call the office at [phone]. You can also message me directly via MyChart.   Sincerely,  Smitty Cords, MD

## 2018-04-15 NOTE — Progress Notes (Signed)
Subjective:  Mackenzie Nichols is a 62 y.o. year old female with PMH of HTN, DM, Obesity, HLD who presents to office today for an annual physical examination.  Concerns today include:  1. Chronic Diabetes: Patient notes that she has not been taking her metformin at all.  She does eat 3 meals a day with snacks in between.  She does like to eat plenty of vegetables.  She has been trying to cut back on sweet foods.  Her fasting glucoses have been very variable, usually within the mid 100s when she does check.  She does not do any formal exercising but does walk around the house a lot.  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  2. Hypertension Blood pressure at home: Does not check Exercise: As above Medications: Compliant with none of her blood pressure medications for the last week she was out of them Side effects: None ROS: Denies headache, dizziness, visual changes, nausea, vomiting, chest pain, abdominal pain or shortness of breath. BP Readings from Last 3 Encounters:  04/15/18 124/88  02/21/17 124/72  09/05/16 138/81    Review of Systems:  Constitutional: Negative for fever and weight loss.  HENT: Negative for ear pain, hearing loss and sinus pain.   Eyes: Negative for blurred vision.  Respiratory: Negative for cough, shortness of breath and wheezing.   Cardiovascular: Negative for chest pain and leg swelling.  Gastrointestinal: Negative for abdominal pain, blood in stool, constipation, diarrhea, heartburn, melena, nausea and vomiting.  Genitourinary: Negative for dysuria and frequency.  Musculoskeletal: Negative for back pain and joint pain.  Skin: Negative for rash.  Neurological: Negative for dizziness, tingling, focal weakness and headaches.  Psychiatric/Behavioral: Negative for depression and suicidal ideas.    General Healthcare: Medication Compliance: Sometimes Dx Hypertension: Yes Dx Hyperlipidemia: Yes Diabetes: Yes Dx Obesity: Yes Weight Loss: No Physical  Activity: Minimal Urinary Incontinence: No  Menstrual hx: Postmenopausal Last dental exam:            Over a year ago  Social:  reports that she has never smoked. She has never used smokeless tobacco. Driving: Drives herself, wears seatbelt Alcohol Use:  No Tobacco no  Other Drugs:  No  Support and Life at Home: Lives by herself Advanced Directives: No Work: Works as a Architect Date Due  . PNEUMOCOCCAL POLYSACCHARIDE VACCINE (1) 01/26/1960  . OPHTHALMOLOGY EXAM  01/26/1968  . URINE MICROALBUMIN  01/26/1968  . MAMMOGRAM  08/18/2016    Past Medical History Past Medical History:  Diagnosis Date  . Borderline diabetes no meds  . Diabetes mellitus, type 2 ([place])   . Fibroid   . Hyperlipidemia   . Hypertension   . Obesity    Patient Active Problem List   Diagnosis Date Noted  . Bed bug bite 01/30/2016  . Postmenopausal bleeding 09/21/2015  . Vaginal neoplasm benign 09/21/2015  . Right shoulder pain 09/16/2014  . Bloody stools 08/09/2014  . Fatigue 07/15/2014  . Essential hypertension, benign 07/15/2014  . Diabetes mellitus, type 2 ([place]) 07/15/2014  . Fibroid uterus 09/01/2013  . Abnormal uterine bleeding 06/16/2013  . Hyperglycemia 06/16/2013  . Hypercholesterolemia 06/16/2013  . Morbid obesity with BMI of 40.0-44.9, adult ([place]) 09/17/2012    Medications- reviewed and updated Current Outpatient Medications  Medication Sig Dispense Refill  . amLODipine (NORVASC) 5 MG tablet TAKE 1 TABLET BY MOUTH EVERY DAY 90 tablet 1  . atorvastatin (LIPITOR) 40 MG  tablet TAKE 1 TABLET BY MOUTH DAILY 30 tablet 11  . ibuprofen (ADVIL,MOTRIN) 600 MG tablet Take 1 tablet (600 mg total) by mouth every 6 (six) hours as needed. 30 tablet 0  . metFORMIN (GLUCOPHAGE XR) 500 MG 24 hr tablet Take 1 tablet (500 mg total) by mouth 2 (two) times daily. 60 tablet 0   No current facility-administered medications for this visit.     Objective: BP 124/88   Pulse 84    Temp 98.2 F (36.8 C) (Oral)   Ht 5\' 2"  (1.575 m)   Wt 227 lb 9.6 oz (103.2 kg)   LMP 06/15/2013   SpO2 95%   BMI 41.63 kg/m  Gen: In no acute distress, alert, cooperative with exam, well groomed HEENT: NCAT, EOMI, PERRL CV: Regular rate and rhythm, normal S1/S2, no murmur Resp: Clear to auscultation bilaterally, no wheezes, non-labored Abd: Soft, Non Tender, Non Distended, bowel sounds present, no guarding or organomegaly Ext: No edema, warm and well perfused Neuro: Alert and oriented, No gross deficits, normal gait Psych: Normal mood and affect  Diabetic Foot Exam - Simple   Simple Foot Form Diabetic Foot exam was performed with the following findings:  Yes 04/15/2018  2:40 PM  Visual Inspection No deformities, no ulcerations, no other skin breakdown bilaterally:  Yes Sensation Testing Intact to touch and monofilament testing bilaterally:  Yes Pulse Check Posterior Tibialis and Dorsalis pulse intact bilaterally:  Yes Comments      Assessment/Plan:  1. Annual physical exam: Patient doing well overall.  She is due for her mammogram and her diabetic eye exam.  Referral was placed for both of those today. -Follow-up in 1 year for next annual physical exam  2. Type 2 diabetes mellitus without complication [facility]): Patient has not been taking her metformin and her A1c is 7.3.  She would like to hold off on metformin at this time and continue lifestyle modification.  Her goal A1c would be < 7 - POCT glycosylated hemoglobin (Hb A1C) -Diabetic foot exam performed today, normal -Discussed importance exercise and continuing to decrease complex and simple carbohydrates from diet - Ambulatory referral to Ophthalmology -We will check another A1c in 3 months  3. Screening for breast cancer - MM Digital Screening; Future  4. Essential hypertension, benign: Controlled at goal and has not been her only antihypertensive (amlodipine).  Discussed that if her BP is under 140/90 consistently  at home then she can continue to hold her amlodipine - Comprehensive metabolic panel - CBC - Hold amlodipine for now as long as her BP is <140/90  5. Hypercholesterolemia: The 10-year ASCVD risk score Mikey Bussing DC Jr., et al., 2013) is: 15.2% Based off of most recent lipid panel. - atorvastatin (LIPITOR) 40 MG tablet; Take 1 tablet (40 mg total) by mouth daily. (Patient not taking: Reported on 04/15/2018)  Dispense: 90 tablet; Refill: [place], MD [place], PGY-3

## 2018-04-16 LAB — COMPREHENSIVE METABOLIC PANEL
A/G RATIO: 1.2 (ref 1.2–2.2)
ALT: 13 IU/L (ref 0–32)
AST: 15 IU/L (ref 0–40)
Albumin: 4 g/dL (ref 3.6–4.8)
Alkaline Phosphatase: 61 IU/L (ref 39–117)
BILIRUBIN TOTAL: 0.2 mg/dL (ref 0.0–1.2)
BUN/Creatinine Ratio: 15 (ref 12–28)
BUN: 12 mg/dL (ref 8–27)
CHLORIDE: 102 mmol/L (ref 96–106)
CO2: 25 mmol/L (ref 20–29)
Calcium: 9.9 mg/dL (ref 8.7–10.3)
Creatinine, Ser: 0.79 mg/dL (ref 0.57–1.00)
GFR calc non Af Amer: 82 mL/min/{1.73_m2} (ref >59)
GFR, EST AFRICAN AMERICAN: 94 mL/min/{1.73_m2} (ref >59)
GLOBULIN, TOTAL: 3.4 g/dL (ref 1.5–4.5)
Glucose: 120 mg/dL — ABNORMAL HIGH (ref 65–99)
POTASSIUM: 4.8 mmol/L (ref 3.5–5.2)
Sodium: 141 mmol/L (ref 134–144)
TOTAL PROTEIN: 7.4 g/dL (ref 6.0–8.5)

## 2018-04-16 LAB — CBC
HEMOGLOBIN: 12.9 g/dL (ref 11.1–15.9)
Hematocrit: 39.7 % (ref 34.0–46.6)
MCH: 28.4 pg (ref 26.6–33.0)
MCHC: 32.5 g/dL (ref 31.5–35.7)
MCV: 87 fL (ref 79–97)
Platelets: 415 10*3/uL — ABNORMAL HIGH (ref 150–379)
RBC: 4.55 x10E6/uL (ref 3.77–5.28)
RDW: 14.1 % (ref 12.3–15.4)
WBC: 6.6 10*3/uL (ref 3.4–10.8)

## 2018-04-23 ENCOUNTER — Encounter: Payer: Self-pay | Admitting: Family Medicine

## 2018-04-23 NOTE — Progress Notes (Signed)
Letter sent patient.  

## 2018-05-13 ENCOUNTER — Ambulatory Visit: Payer: Self-pay

## 2018-06-11 ENCOUNTER — Ambulatory Visit: Payer: Self-pay

## 2018-07-02 ENCOUNTER — Ambulatory Visit: Payer: Self-pay

## 2018-07-28 ENCOUNTER — Ambulatory Visit
Admission: RE | Admit: 2018-07-28 | Discharge: 2018-07-28 | Disposition: A | Discharge status: Home / Self Care | Payer: Managed Care, Other (non HMO) | Source: Ambulatory Visit | Attending: Family Medicine | Admitting: Family Medicine

## 2018-07-28 ENCOUNTER — Encounter: Payer: Self-pay | Admitting: Family Medicine

## 2018-07-28 DIAGNOSIS — Z1239 Encounter for other screening for malignant neoplasm of breast: Secondary | ICD-10-CM

## 2018-08-06 ENCOUNTER — Ambulatory Visit: Payer: Managed Care, Other (non HMO) | Admitting: Family Medicine

## 2019-03-02 ENCOUNTER — Other Ambulatory Visit: Payer: Self-pay

## 2019-03-02 ENCOUNTER — Ambulatory Visit (INDEPENDENT_AMBULATORY_CARE_PROVIDER_SITE_OTHER): Payer: Managed Care, Other (non HMO) | Admitting: Family Medicine

## 2019-03-02 VITALS — BP 126/80 | HR 77 | Temp 98.0°F | Wt 238.6 lb

## 2019-03-02 DIAGNOSIS — B309 Viral conjunctivitis, unspecified: Secondary | ICD-10-CM | POA: Diagnosis not present

## 2019-03-02 DIAGNOSIS — E119 Type 2 diabetes mellitus without complications: Secondary | ICD-10-CM | POA: Diagnosis not present

## 2019-03-02 DIAGNOSIS — H109 Unspecified conjunctivitis: Secondary | ICD-10-CM | POA: Insufficient documentation

## 2019-03-02 LAB — POCT GLYCOSYLATED HEMOGLOBIN (HGB A1C): HBA1C, POC (CONTROLLED DIABETIC RANGE): 7 % (ref 0.0–7.0)

## 2019-03-02 NOTE — Progress Notes (Signed)
Subjective  Mackenzie Nichols is a 63 y.o. female is presenting with the following  EYE IRRITATION Grandson had pink eye last week.  Is normal now.  She had mild irritation and redness on Saturday and use his drops once.  Now would like to return to work.  Has no discharge or redness but still slight dry feeling.  No history of allergies.  No pain or vision changes  Chief Complaint noted Review of Symptoms - see HPI PMH - Smoking status noted.    Objective Vital Signs reviewed BP 126/80   Pulse 77   Temp 98 F (36.7 C) (Oral)   Wt 238 lb 9.6 oz (108.2 kg)   LMP 06/15/2013   SpO2 97%   BMI 43.64 kg/m  Eye - Pupils Equal Round Reactive to light, Extraocular movements intact, Fundi without hemorrhage or visible lesions, Conjunctiva without significant redness or discharge Skin:  Intact without suspicious lesions or rashes   Assessments/Plans  See after visit summary for details of patient instuctions  Conjunctivitis Viral. Acute.  Clearing on her own.  Unlikely to be allergies since she does not have a history of

## 2019-03-02 NOTE — Patient Instructions (Signed)
Good to see you today!  Thanks for coming in.  I think you have gotten over the viral pink eye by yourself  If your eyes are not 100% back to normal feeling in 2-3 days or if you have more discharge or any pain then call us  [phone] and ask for me Dr Erin Hearing  Continue to wash your hands

## 2019-03-02 NOTE — Assessment & Plan Note (Signed)
Viral. Acute.  Clearing on her own.  Unlikely to be allergies since she does not have a history of

## 2019-03-02 NOTE — Progress Notes (Signed)
a1c

## 2019-05-28 DIAGNOSIS — R0602 Shortness of breath: Secondary | ICD-10-CM | POA: Diagnosis not present

## 2019-05-28 DIAGNOSIS — I10 Essential (primary) hypertension: Secondary | ICD-10-CM | POA: Diagnosis not present

## 2019-05-28 DIAGNOSIS — R000 Tachycardia, unspecified: Secondary | ICD-10-CM | POA: Diagnosis not present

## 2019-05-28 DIAGNOSIS — R069 Unspecified abnormalities of breathing: Secondary | ICD-10-CM | POA: Diagnosis not present

## 2019-06-01 ENCOUNTER — Ambulatory Visit: Payer: Self-pay | Admitting: Family Medicine

## 2019-06-25 ENCOUNTER — Ambulatory Visit (INDEPENDENT_AMBULATORY_CARE_PROVIDER_SITE_OTHER): Payer: BC Managed Care – PPO | Admitting: Family Medicine

## 2019-06-25 ENCOUNTER — Other Ambulatory Visit: Payer: Self-pay

## 2019-06-25 ENCOUNTER — Encounter: Payer: Self-pay | Admitting: Family Medicine

## 2019-06-25 VITALS — BP 132/70 | HR 88

## 2019-06-25 DIAGNOSIS — R1013 Epigastric pain: Secondary | ICD-10-CM | POA: Diagnosis not present

## 2019-06-25 DIAGNOSIS — E119 Type 2 diabetes mellitus without complications: Secondary | ICD-10-CM | POA: Diagnosis not present

## 2019-06-25 LAB — POCT GLYCOSYLATED HEMOGLOBIN (HGB A1C): HbA1c, POC (controlled diabetic range): 7.4 % — AB (ref 0.0–7.0)

## 2019-06-25 MED ORDER — FAMOTIDINE 40 MG PO TABS: 40.0000 mg | ORAL_TABLET | Freq: Every day | ORAL | 2 refills | Status: DC | PRN

## 2019-06-25 NOTE — Assessment & Plan Note (Signed)
Hemoglobin A1c is 7.4 today, slightly increased from her previous value of 7.03 months ago.  However, her A1c was 7.31-year ago.  She was advised that she may benefit from the addition of metformin, but she elects to continue to manage her diabetes with diet and exercise alone.  We discussed ways of improving her diet and exercise plan.

## 2019-06-25 NOTE — Patient Instructions (Addendum)
It was nice meeting you today Ms. Mackenzie Nichols!  For your stomach pain, please try Pepcid once daily as needed.  If you continue to have stomach pain and nausea, we can try a stronger medication, and we can always also get a CT scan and refer you to a GI doctor if it continues to be a problem for you.  For your diabetes, we are checking some labs today and we will let you know if any of them are abnormal.  Please continue to work on avoiding sugary foods and eating plenty of fruits and vegetables.  I would like to see you back in about 3 months or as needed.  If you have any questions or concerns, please feel free to call the clinic.   Be well,  Dr. Shan Levans

## 2019-06-25 NOTE — Progress Notes (Signed)
Subjective:    Mackenzie Nichols - 63 y.o. female MRN [id number]  Date of birth: January 22, 1956  CC:  Mackenzie Nichols is here for follow up of diabetes.  She would also like to discuss her epigastric pain.  HPI:  Type 2 diabetes Patient is continuing to use diet for control and is not interested in taking medicines for this.  She is open to taking metformin in the future if her A1c worsens.  She is not interested in the pneumococcal 23 valent vaccine today.  Epigastric pain Aching pain in epigastric area that started two weeks ago and was preceded by a few weeks of nausea.  She has not had any aching pain since 2 weeks ago, but she continues to have some discomfort and nausea.  Eating greasy food and eating a large volume of food makes her pain worse.  She finds that lying flat also makes her pain worse and will set up after meals to avoid this feeling.  She says that she has had a hernia repair about 10 years ago in the epigastric area, which she thinks was a hiatal hernia.  She is worried that she has had a recurrence of this hernia due to the symptoms.  Health Maintenance:  Health Maintenance Due  Topic Date Due  . PNEUMOCOCCAL POLYSACCHARIDE VACCINE AGE 75-64 HIGH RISK  01/26/1960  . OPHTHALMOLOGY EXAM  01/26/1968  . URINE MICROALBUMIN  01/26/1968  . FOOT EXAM  04/16/2019    -  reports that she has never smoked. She has never used smokeless tobacco. - Review of Systems: Per HPI. - Past Medical History: Patient Active Problem List   Diagnosis Date Noted  . Epigastric pain 06/25/2019  . Conjunctivitis 03/02/2019  . Vaginal neoplasm benign 09/21/2015  . Bloody stools 08/09/2014  . Essential hypertension, benign 07/15/2014  . Diabetes mellitus, type 2 ([place]) 07/15/2014  . Fibroid uterus 09/01/2013  . Hypercholesterolemia 06/16/2013  . Morbid obesity with BMI of 40.0-44.9, adult ([place]) 09/17/2012   - Medications: reviewed and updated   Objective:   Physical Exam BP 132/70   Pulse  88   LMP 06/15/2013   SpO2 96%  Gen: NAD, alert, cooperative with exam, well-appearing, pleasant, obese HEENT: NCAT, clear conjunctiva, supple neck CV: RRR, good S1/S2, no murmur, no edema Resp: CTABL, no wheezes, non-labored Abd: Soft, nontender to palpation, BS present, no guarding or organomegaly Skin: no rashes, normal turgor  Neuro: no gross deficits.  Psych: good insight, alert and oriented        Assessment & Plan:   Diabetes mellitus, type 2 Hemoglobin A1c is 7.4 today, slightly increased from her previous value of 7.03 months ago.  However, her A1c was 7.31-year ago.  She was advised that she may benefit from the addition of metformin, but she elects to continue to manage her diabetes with diet and exercise alone.  We discussed ways of improving her diet and exercise plan.  Epigastric pain While it is possible that patient's pain is due to a recurrent hiatal hernia, but her epigastric discomfort sounds most consistent with acid reflux currently.  We will start a trial of Pepcid daily as needed and can advance to a PPI if her discomfort continues.  If these measures do not improve her pain, we can pursue a CT scan to evaluate for hiatal hernia and possibly refer to GI.  Patient was encouraged to let us know how her pain response to these measures.  Peptic ulcer is also a possibility,  but less likely due to lack of melena.    Maia Breslow, M.D. 06/25/2019, 2:51 PM PGY-3, [place]

## 2019-06-25 NOTE — Assessment & Plan Note (Signed)
While it is possible that patient's pain is due to a recurrent hiatal hernia, but her epigastric discomfort sounds most consistent with acid reflux currently.  We will start a trial of Pepcid daily as needed and can advance to a PPI if her discomfort continues.  If these measures do not improve her pain, we can pursue a CT scan to evaluate for hiatal hernia and possibly refer to GI.  Patient was encouraged to let us know how her pain response to these measures.  Peptic ulcer is also a possibility, but less likely due to lack of melena.

## 2019-06-26 LAB — LIPID PANEL
Chol/HDL Ratio: 4.8 ratio — ABNORMAL HIGH (ref 0.0–4.4)
Cholesterol, Total: 232 mg/dL — ABNORMAL HIGH (ref 100–199)
HDL: 48 mg/dL (ref >39)
LDL Calculated: 158 mg/dL — ABNORMAL HIGH (ref 0–99)
Triglycerides: 132 mg/dL (ref 0–149)
VLDL Cholesterol Cal: 26 mg/dL (ref 5–40)

## 2019-06-26 LAB — BASIC METABOLIC PANEL
BUN/Creatinine Ratio: 12 (ref 12–28)
BUN: 11 mg/dL (ref 8–27)
CO2: 24 mmol/L (ref 20–29)
Calcium: 9.7 mg/dL (ref 8.7–10.3)
Chloride: 97 mmol/L (ref 96–106)
Creatinine, Ser: 0.91 mg/dL (ref 0.57–1.00)
GFR calc Af Amer: 79 mL/min/{1.73_m2} (ref >59)
GFR calc non Af Amer: 68 mL/min/{1.73_m2} (ref >59)
Glucose: 244 mg/dL — ABNORMAL HIGH (ref 65–99)
Potassium: 4.9 mmol/L (ref 3.5–5.2)
Sodium: 139 mmol/L (ref 134–144)

## 2019-07-06 ENCOUNTER — Telehealth: Payer: Self-pay | Admitting: Family Medicine

## 2019-07-06 NOTE — Telephone Encounter (Signed)
Left voicemail on Mackenzie Nichols mobile number explaining that her LDL cholesterol was elevated and her risk of a heart attack or stroke in the next 10 years is about a 1 in 5 chance, which is quite high.  In patients who have diabetes, cholesterol-lowering medications have been shown to reduce the risk of heart attack and stroke and prolong life, and I think she would benefit from this medication.  I have asked her to call and let me know if she would like to start a medication to lower her cholesterol.

## 2019-09-14 ENCOUNTER — Encounter: Payer: Self-pay | Admitting: Gynecology

## 2019-10-07 ENCOUNTER — Other Ambulatory Visit: Payer: Self-pay | Admitting: Family Medicine

## 2019-10-07 DIAGNOSIS — Z1231 Encounter for screening mammogram for malignant neoplasm of breast: Secondary | ICD-10-CM

## 2019-11-29 ENCOUNTER — Ambulatory Visit: Payer: BC Managed Care – PPO

## 2019-12-15 ENCOUNTER — Ambulatory Visit
Admission: RE | Admit: 2019-12-15 | Discharge: 2019-12-15 | Disposition: A | Discharge status: Home / Self Care | Payer: BC Managed Care – PPO | Source: Ambulatory Visit | Attending: *Deleted | Admitting: *Deleted

## 2019-12-15 ENCOUNTER — Other Ambulatory Visit: Payer: Self-pay

## 2019-12-15 DIAGNOSIS — Z1231 Encounter for screening mammogram for malignant neoplasm of breast: Secondary | ICD-10-CM | POA: Diagnosis not present

## 2020-08-15 ENCOUNTER — Other Ambulatory Visit: Payer: Self-pay

## 2020-08-15 ENCOUNTER — Ambulatory Visit (HOSPITAL_COMMUNITY)
Admission: EM | Admit: 2020-08-15 | Discharge: 2020-08-15 | Disposition: A | Discharge status: Home / Self Care | Payer: 59 | Attending: Internal Medicine | Admitting: Internal Medicine

## 2020-08-15 ENCOUNTER — Encounter (HOSPITAL_COMMUNITY): Payer: Self-pay

## 2020-08-15 DIAGNOSIS — Z20822 Contact with and (suspected) exposure to covid-19: Secondary | ICD-10-CM

## 2020-08-15 NOTE — ED Triage Notes (Signed)
Pt presents for COVID testing. Pt denies fever, shortness of breath, cough, or any other symptoms.    

## 2020-08-16 ENCOUNTER — Ambulatory Visit: Payer: 59

## 2020-08-16 ENCOUNTER — Telehealth: Payer: Self-pay | Admitting: *Deleted

## 2020-08-16 ENCOUNTER — Ambulatory Visit: Payer: BC Managed Care – PPO

## 2020-08-16 ENCOUNTER — Telehealth: Payer: Self-pay

## 2020-08-16 LAB — SARS CORONAVIRUS 2 (TAT 6-24 HRS): SARS Coronavirus 2: POSITIVE — AB

## 2020-08-16 NOTE — Telephone Encounter (Signed)
Pt notified of positive COVID-19 test results. Pt verbalized understanding. Pt reports that she has mild cough .Pt advised to remain in self quarantine until at least 10 days since symptom onset And at least 24 hours fever free without antipyretics And improvement in respiratory symptoms. Patient advised to utilize over the counter medications to treat symptoms. Pt advised to seek treatment in the ED if respiratory issues/distress develops.Pt advised they should only leave home to seek and medical care and must wear a mask in public. Pt instructed to limit contact with family members or caregivers in the home. Pt advised to practice social distancing and to continue to use good preventative care measures such has frequent hand washing, staying out of crowds and cleaning hard surfaces frequently touched in the home.Pt informed that the health department will likely follow up and may have additional recommendations.

## 2020-08-16 NOTE — Telephone Encounter (Signed)
Pt calling for covid results, positive. Alerted to positive results, system down and call dropped. Attempted multiple times to reach patient, "Call cannot be completed at this time."  Will continue attempts. [facility] will be notified.

## 2020-08-17 ENCOUNTER — Telehealth (HOSPITAL_COMMUNITY): Payer: Self-pay | Admitting: Internal Medicine

## 2020-08-17 ENCOUNTER — Encounter (HOSPITAL_COMMUNITY): Payer: Self-pay | Admitting: Internal Medicine

## 2020-08-17 ENCOUNTER — Other Ambulatory Visit: Payer: Self-pay | Admitting: Internal Medicine

## 2020-08-17 DIAGNOSIS — E1169 Type 2 diabetes mellitus with other specified complication: Secondary | ICD-10-CM

## 2020-08-17 DIAGNOSIS — U071 COVID-19: Secondary | ICD-10-CM

## 2020-08-17 NOTE — Telephone Encounter (Signed)
I connected by phone with Mackenzie Nichols on 08/17/2020 at 9:17 AM to discuss the potential use of a new treatment for mild to moderate COVID-19 viral infection in non-hospitalized patients.  This patient is a 64 y.o. female that meets the FDA criteria for Emergency Use Authorization of COVID monoclonal antibody casirivimab/imdevimab.  Has a (+) direct SARS-CoV-2 viral test result  Has mild or moderate COVID-19   Is NOT hospitalized due to COVID-19  Is within 10 days of symptom onset  Has at least one of the high risk factor(s) for progression to severe COVID-19 and/or hospitalization as defined in EUA.  Specific high risk criteria : Diabetes, htn, obesity   I have spoken and communicated the following to the patient or parent/caregiver regarding COVID monoclonal antibody treatment:  1. FDA has authorized the emergency use for the treatment of mild to moderate COVID-19 in adults and pediatric patients with positive results of direct SARS-CoV-2 viral testing who are 81 years of age and older weighing at least 40 kg, and who are at high risk for progressing to severe COVID-19 and/or hospitalization.  2. The significant known and potential risks and benefits of COVID monoclonal antibody, and the extent to which such potential risks and benefits are unknown.  3. Information on available alternative treatments and the risks and benefits of those alternatives, including clinical trials.  4. Patients treated with COVID monoclonal antibody should continue to self-isolate and use infection control measures (e.g., wear mask, isolate, social distance, avoid sharing personal items, clean and disinfect "high touch" surfaces, and frequent handwashing) according to CDC guidelines.   5. The patient or parent/caregiver has the option to accept or refuse COVID monoclonal antibody treatment.  After reviewing this information with the patient, The patient agreed to proceed with receiving casirivimab\imdevimab  infusion and will be provided a copy of the Fact sheet prior to receiving the infusion.   [name], NP 08/17/2020 9:17 AM

## 2020-08-18 ENCOUNTER — Ambulatory Visit (HOSPITAL_COMMUNITY)
Admission: RE | Admit: 2020-08-18 | Discharge: 2020-08-18 | Disposition: A | Discharge status: Home / Self Care | Payer: 59 | Source: Ambulatory Visit | Attending: Pulmonary Disease | Admitting: Pulmonary Disease

## 2020-08-18 DIAGNOSIS — U071 COVID-19: Secondary | ICD-10-CM | POA: Insufficient documentation

## 2020-08-18 DIAGNOSIS — E1169 Type 2 diabetes mellitus with other specified complication: Secondary | ICD-10-CM

## 2020-08-18 MED ORDER — ALBUTEROL SULFATE HFA 108 (90 BASE) MCG/ACT IN AERS: 2.0000 | INHALATION_SPRAY | Freq: Once | RESPIRATORY_TRACT | Status: DC | PRN

## 2020-08-18 MED ORDER — EPINEPHRINE 0.3 MG/0.3ML IJ SOAJ: 0.3000 mg | Freq: Once | INTRAMUSCULAR | Status: DC | PRN

## 2020-08-18 MED ORDER — SODIUM CHLORIDE 0.9 % IV SOLN
1200.0000 mg | Freq: Once | INTRAVENOUS | Status: AC
  Administered 2020-08-18: 1200 mg via INTRAVENOUS
  Filled 2020-08-18: qty 10

## 2020-08-18 MED ORDER — METHYLPREDNISOLONE SODIUM SUCC 125 MG IJ SOLR: 125.0000 mg | Freq: Once | INTRAMUSCULAR | Status: DC | PRN

## 2020-08-18 MED ORDER — FAMOTIDINE IN NACL 20-0.9 MG/50ML-% IV SOLN: 20.0000 mg | Freq: Once | INTRAVENOUS | Status: DC | PRN

## 2020-08-18 MED ORDER — DIPHENHYDRAMINE HCL 50 MG/ML IJ SOLN: 50.0000 mg | Freq: Once | INTRAMUSCULAR | Status: DC | PRN

## 2020-08-18 MED ORDER — SODIUM CHLORIDE 0.9 % IV SOLN: INTRAVENOUS | Status: DC | PRN

## 2020-08-18 NOTE — Discharge Instructions (Signed)

## 2020-08-18 NOTE — Progress Notes (Addendum)
  Diagnosis: COVID-19  Physician: DR. PATRICK WRIGHT  Procedure: [facility] Med: casirivimab\imdevimab infusion - Provided patient with casirivimab\imdevimab fact sheet for patients, parents and caregivers prior to infusion.  Complications: No immediate complications noted.  Discharge: Discharged home   Randa Evens [facility] 08/18/2020

## 2020-11-22 ENCOUNTER — Encounter: Payer: Self-pay | Admitting: Family Medicine

## 2020-11-22 ENCOUNTER — Other Ambulatory Visit: Payer: Self-pay

## 2020-11-22 ENCOUNTER — Ambulatory Visit (INDEPENDENT_AMBULATORY_CARE_PROVIDER_SITE_OTHER): Payer: 59 | Admitting: Family Medicine

## 2020-11-22 VITALS — BP 170/100 | HR 68 | Ht 62.0 in | Wt 243.0 lb

## 2020-11-22 DIAGNOSIS — E119 Type 2 diabetes mellitus without complications: Secondary | ICD-10-CM | POA: Diagnosis not present

## 2020-11-22 DIAGNOSIS — I10 Essential (primary) hypertension: Secondary | ICD-10-CM

## 2020-11-22 DIAGNOSIS — E7800 Pure hypercholesterolemia, unspecified: Secondary | ICD-10-CM | POA: Diagnosis not present

## 2020-11-22 DIAGNOSIS — Z23 Encounter for immunization: Secondary | ICD-10-CM | POA: Diagnosis not present

## 2020-11-22 LAB — POCT GLYCOSYLATED HEMOGLOBIN (HGB A1C): Hemoglobin A1C: 7.8 % — AB (ref 4.0–5.6)

## 2020-11-22 MED ORDER — LOSARTAN POTASSIUM 25 MG PO TABS: 25.0000 mg | ORAL_TABLET | Freq: Every day | ORAL | 3 refills | Status: DC

## 2020-11-22 MED ORDER — ATORVASTATIN CALCIUM 40 MG PO TABS: 40.0000 mg | ORAL_TABLET | Freq: Every day | ORAL | 3 refills | Status: DC

## 2020-11-22 NOTE — Assessment & Plan Note (Signed)
Patient took her self off of her amlodipine 3 years ago.  Previous blood pressures recorded in her chart indicate that the last 3 visits over the past year have been elevated, but prior to that were normotensive.  Patient also endorses elevated blood pressures at home.  Patient okay with starting a new medication.  We will start losartan 25 mg, get BMP today, and follow-up in 1 week with recheck of labs and blood pressure

## 2020-11-22 NOTE — Patient Instructions (Addendum)
It was nice to meet you today,  For your blood pressure, I have started a medication called losartan.  Please take this medication once a day in the evening.  I would like to have you follow-up in 1 week so we can recheck your blood pressure and recheck your kidney function.  For your diabetes, I would encourage you to start taking Metformin, but I understand if you want to try to continue to manage it with diet and exercise.  Our nutritionist, Dr. Iver Nestle, would be helpful in discussing healthy eating choices with you.  I have included her card and her number and email is below.  I would like to see you back in 1 week with me or your PCP, Dr. Posey Pronto.  We have refilled your Lipitor.  You can take this in the evening as well with your blood pressure medication.  Iver Nestle [phone] [email or web address]  Have a great day,  Clemetine Marker, MD

## 2020-11-22 NOTE — Assessment & Plan Note (Signed)
A1c 7.8 today.  Continues to increase.  Patient states she is using diet and exercise to help, but does not have a solid plan for achieving this goal.  Patient still wishes to avoid medications so I have advised her to talk with Dr. Jenne Campus, our nutritionist.  Hanley Seamen the patient her number and email address advised her to reach out to her.  Discussed foods to avoid such as starches, carbohydrates, desserts, sugary beverages.  Normal foot exam.  When she follows up in 1 week we can discuss ophthalmology referral.

## 2020-11-22 NOTE — Progress Notes (Signed)
    SUBJECTIVE:   CHIEF COMPLAINT / HPI:    HTN: Patient states she has not been taking her amlodipine for the past 3 years.  She felt it was not helping her.  She takes her blood pressure at home and says sometimes it is over 500 systolic and sometimes as below.  She is interested in restarting medication if necessary.  She has never tried an ACE inhibitor or ARB before.    DM: Patient states she is trying to manage her diabetes with diet and exercise.  When asked about her particular diet she says she eats less 'junk food'.  She does not eat a lot of bread or starches.  She does not drink sugary beverages.  She takes apple cider vinegar every day.  She has not been working, and not exercise.  She is starting work at [employer] today where she states she will get plenty of exercise.  She denies tingling sensation in her feet or hands.  We discussed what the A1c for a diabetic should be.  Covid vaccination: Patient does not have any questions about receiving her Covid vaccine today.  PERTINENT  PMH / PSH: Diabetes, hypertension  OBJECTIVE:   BP (!) 170/100   Pulse 68   Ht 5\' 2"  (1.575 m)   Wt 243 lb (110.2 kg)   LMP 06/15/2013   SpO2 98%   BMI 44.45 kg/m   General: Alert and oriented.  No acute distress CV: Regular rate and rhythm Extremities: Normal foot exam with normal vibratory and sensation to pinprick bilaterally.  No ulcers.  2+ DP pulses bilaterally.  ASSESSMENT/PLAN:   Essential hypertension, benign Patient took her self off of her amlodipine 3 years ago.  Previous blood pressures recorded in her chart indicate that the last 3 visits over the past year have been elevated, but prior to that were normotensive.  Patient also endorses elevated blood pressures at home.  Patient okay with starting a new medication.  We will start losartan 25 mg, get BMP today, and follow-up in 1 week with recheck of labs and blood pressure  Diabetes mellitus, type 2 A1c 7.8 today.  Continues to  increase.  Patient states she is using diet and exercise to help, but does not have a solid plan for achieving this goal.  Patient still wishes to avoid medications so I have advised her to talk with Dr. Jenne Campus, our nutritionist.  Hanley Seamen the patient her number and email address advised her to reach out to her.  Discussed foods to avoid such as starches, carbohydrates, desserts, sugary beverages.  Normal foot exam.  When she follows up in 1 week we can discuss ophthalmology referral.     Benay Pike, MD [place]

## 2020-11-23 NOTE — Addendum Note (Signed)
Addended by: Benay Pike on: 11/23/2020 02:42 PM   Modules accepted: Orders

## 2020-11-27 ENCOUNTER — Ambulatory Visit: Payer: 59 | Admitting: Family Medicine

## 2020-12-13 ENCOUNTER — Ambulatory Visit: Payer: 59

## 2020-12-13 ENCOUNTER — Encounter: Payer: Self-pay | Admitting: Family Medicine

## 2020-12-13 ENCOUNTER — Ambulatory Visit (INDEPENDENT_AMBULATORY_CARE_PROVIDER_SITE_OTHER): Payer: 59 | Admitting: Family Medicine

## 2020-12-13 ENCOUNTER — Other Ambulatory Visit: Payer: Self-pay

## 2020-12-13 VITALS — BP 135/80 | HR 91 | Ht 62.0 in | Wt 242.2 lb

## 2020-12-13 DIAGNOSIS — Z23 Encounter for immunization: Secondary | ICD-10-CM

## 2020-12-13 DIAGNOSIS — E7800 Pure hypercholesterolemia, unspecified: Secondary | ICD-10-CM

## 2020-12-13 DIAGNOSIS — E119 Type 2 diabetes mellitus without complications: Secondary | ICD-10-CM | POA: Diagnosis not present

## 2020-12-13 DIAGNOSIS — I10 Essential (primary) hypertension: Secondary | ICD-10-CM

## 2020-12-13 MED ORDER — METFORMIN HCL ER 500 MG PO TB24: 500.0000 mg | ORAL_TABLET | Freq: Two times a day (BID) | ORAL | 0 refills | Status: DC

## 2020-12-13 NOTE — Patient Instructions (Signed)
It was great seeing you today!  Today we discussed diet and exercise, this can help you control both your blood pressure and sugar levels. I have prescribed metformin 500 mg, please take this medication twice daily. Continue to eat healthy and exercise as we discussed.   Congratulations on getting your COVID vaccine, please know that you may not feel your best for a few days and that is completely normal.  Please follow up in 1 month, if anything arises between now and then, please don't hesitate to contact our office.   Thank you for allowing [place] to be a part of your medical care!  Thank you, Dr. Robyne Peers   Diabetes Mellitus and Nutrition, Adult When you have diabetes (diabetes mellitus), it is very important to have healthy eating habits because your blood sugar (glucose) levels are greatly affected by what you eat and drink. Eating healthy foods in the appropriate amounts, at about the same times every day, can help you:  Control your blood glucose.  Lower your risk of heart disease.  Improve your blood pressure.  Reach or maintain a healthy weight. Every person with diabetes is different, and each person has different needs for a meal plan. Your health care provider may recommend that you work with a diet and nutrition specialist (dietitian) to make a meal plan that is best for you. Your meal plan may vary depending on factors such as:  The calories you need.  The medicines you take.  Your weight.  Your blood glucose, blood pressure, and cholesterol levels.  Your activity level.  Other health conditions you have, such as heart or kidney disease. How do carbohydrates affect me? Carbohydrates, also called carbs, affect your blood glucose level more than any other type of food. Eating carbs naturally raises the amount of glucose in your blood. Carb counting is a method for keeping track of how many carbs you eat. Counting carbs is important to keep your blood glucose at a healthy  level, especially if you use insulin or take certain oral diabetes medicines. It is important to know how many carbs you can safely have in each meal. This is different for every person. Your dietitian can help you calculate how many carbs you should have at each meal and for each snack. Foods that contain carbs include:  Bread, cereal, rice, pasta, and crackers.  Potatoes and corn.  Peas, beans, and lentils.  Milk and yogurt.  Fruit and juice.  Desserts, such as cakes, cookies, ice cream, and candy. How does alcohol affect me? Alcohol can cause a sudden decrease in blood glucose (hypoglycemia), especially if you use insulin or take certain oral diabetes medicines. Hypoglycemia can be a life-threatening condition. Symptoms of hypoglycemia (sleepiness, dizziness, and confusion) are similar to symptoms of having too much alcohol. If your health care provider says that alcohol is safe for you, follow these guidelines:  Limit alcohol intake to no more than 1 drink per day for nonpregnant women and 2 drinks per day for men. One drink equals 12 oz of beer, 5 oz of wine, or 1 oz of hard liquor.  Do not drink on an empty stomach.  Keep yourself hydrated with water, diet soda, or unsweetened iced tea.  Keep in mind that regular soda, juice, and other mixers may contain a lot of sugar and must be counted as carbs. What are tips for following this plan?  Reading food labels  Start by checking the serving size on the "Nutrition Facts" label of  packaged foods and drinks. The amount of calories, carbs, fats, and other nutrients listed on the label is based on one serving of the item. Many items contain more than one serving per package.  Check the total grams (g) of carbs in one serving. You can calculate the number of servings of carbs in one serving by dividing the total carbs by 15. For example, if a food has 30 g of total carbs, it would be equal to 2 servings of carbs.  Check the number of  grams (g) of saturated and trans fats in one serving. Choose foods that have low or no amount of these fats.  Check the number of milligrams (mg) of salt (sodium) in one serving. Most people should limit total sodium intake to less than 2,300 mg per day.  Always check the nutrition information of foods labeled as "low-fat" or "nonfat". These foods may be higher in added sugar or refined carbs and should be avoided.  Talk to your dietitian to identify your daily goals for nutrients listed on the label. Shopping  Avoid buying canned, premade, or processed foods. These foods tend to be high in fat, sodium, and added sugar.  Shop around the outside edge of the grocery store. This includes fresh fruits and vegetables, bulk grains, fresh meats, and fresh dairy. Cooking  Use low-heat cooking methods, such as baking, instead of high-heat cooking methods like deep frying.  Cook using healthy oils, such as olive, canola, or sunflower oil.  Avoid cooking with butter, cream, or high-fat meats. Meal planning  Eat meals and snacks regularly, preferably at the same times every day. Avoid going long periods of time without eating.  Eat foods high in fiber, such as fresh fruits, vegetables, beans, and whole grains. Talk to your dietitian about how many servings of carbs you can eat at each meal.  Eat 4-6 ounces (oz) of lean protein each day, such as lean meat, chicken, fish, eggs, or tofu. One oz of lean protein is equal to: ? 1 oz of meat, chicken, or fish. ? 1 egg. ?  cup of tofu.  Eat some foods each day that contain healthy fats, such as avocado, nuts, seeds, and fish. Lifestyle  Check your blood glucose regularly.  Exercise regularly as told by your health care provider. This may include: ? 150 minutes of moderate-intensity or vigorous-intensity exercise each week. This could be brisk walking, biking, or water aerobics. ? Stretching and doing strength exercises, such as yoga or  weightlifting, at least 2 times a week.  Take medicines as told by your health care provider.  Do not use any products that contain nicotine or tobacco, such as cigarettes and e-cigarettes. If you need help quitting, ask your health care provider.  Work with a Social worker or diabetes educator to identify strategies to manage stress and any emotional and social challenges. Questions to ask a health care provider  Do I need to meet with a diabetes educator?  Do I need to meet with a dietitian?  What number can I call if I have questions?  When are the best times to check my blood glucose? Where to find more information:  American Diabetes Association: diabetes.org  Academy of Nutrition and Dietetics: www.eatright.[employer] of Diabetes and Digestive and Kidney Diseases (NIH): [employer] Summary  A healthy meal plan will help you control your blood glucose and maintain a healthy lifestyle.  Working with a diet and nutrition specialist (dietitian) can help you make a meal  plan that is best for you.  Keep in mind that carbohydrates (carbs) and alcohol have immediate effects on your blood glucose levels. It is important to count carbs and to use alcohol carefully. This information is not intended to replace advice given to you by your health care provider. Make sure you discuss any questions you have with your health care provider. Document Revised: 11/14/2017 Document Reviewed: 01/06/2017 Elsevier Patient Education  2020 [employer].

## 2020-12-13 NOTE — Assessment & Plan Note (Addendum)
-  pending lipid panel -continue atorvastatin 40 mg daily -diet and exercise counseling

## 2020-12-13 NOTE — Assessment & Plan Note (Signed)
-  BP 135/80 today -continue losartan 25 mg daily -diet and exercise counseling -pending BMP

## 2020-12-13 NOTE — Progress Notes (Signed)
    SUBJECTIVE:   CHIEF COMPLAINT / HPI:   Hypertension Patient recently started on losartan 25 mg on 11/22/2020. Endorses daily compliance with medication and has been tolerating it well. Currently on on monotherapy for blood pressure control. Denies chest pain and dyspnea.   PERTINENT  PMH / PSH:   Type 2 DM Patient currently not taking any medication, previously prescribed metformin but did not want to take it at that time. Last A1c 7.8 on 12/8. Today, patient expresses interest in starting on metformin for better blood glucose control. Reports checking blood glucose levels daily, ranges between 120-140. Denies any hypoglycemic episodes or dizziness. States that she has been eating salads for a lot of her meals and cutting down on sweets. States that she tries to find time to exercise and would like to be more active on a more regular basis. Patient had a visit with her ophthalmologist a few months ago when she received a new pair of eyeglasses.  Hypercholesteremia Reports starting to take atorvastatin 40 mg daily, denies myalgias. Tolerating medication well thus far. Exercises occasionally and has been trying to eat more healthy. Last lipid panel notable for elevated cholesterol of 232.   OBJECTIVE:   BP 135/80   Pulse 91   Ht 5\' 2"  (1.575 m)   Wt 242 lb 3.2 oz (109.9 kg)   LMP 06/15/2013   SpO2 98%   BMI 44.30 kg/m   General: Patient well-appearing, in no acute distress. CV: RRR, no murmurs or gallops appreciated Resp: lungs clear to auscultation bilaterally Abdomen: soft, nontender, presence of active bowel sounds Ext: radial pulses strong and equal bilaterally, no LE edema noted bilaterally  Neuro: normal gait Psych: mood appropriate, pleasant   ASSESSMENT/PLAN:   Essential hypertension, benign -BP 135/80 today -continue losartan 25 mg daily -diet and exercise counseling -pending BMP  Diabetes mellitus, type 2 -start newly prescribed metformin 500 mg bid -will  follow up in 1 month to see if patient is tolerating medication, will again follow up in 2-3 months for repeat A1c -extensive diet and exercise counseling -reminded patient of annual ophtho exam   Hypercholesterolemia -pending lipid panel -continue atorvastatin 40 mg daily -diet and exercise counseling   Med rec reviewed. Patient received COVID vaccine, discussed that she may not feel well and return precautions. Patient will follow up in 1 month for diabetic regimen check.   08/16/2013, DO [facility]

## 2020-12-13 NOTE — Assessment & Plan Note (Signed)
-  start newly prescribed metformin 500 mg bid -will follow up in 1 month to see if patient is tolerating medication, will again follow up in 2-3 months for repeat A1c -extensive diet and exercise counseling -reminded patient of annual ophtho exam

## 2020-12-14 ENCOUNTER — Encounter: Payer: Self-pay | Admitting: Family Medicine

## 2020-12-14 LAB — BASIC METABOLIC PANEL
BUN/Creatinine Ratio: 7 — ABNORMAL LOW (ref 12–28)
BUN: 7 mg/dL — ABNORMAL LOW (ref 8–27)
CO2: 23 mmol/L (ref 20–29)
Calcium: 9.6 mg/dL (ref 8.7–10.3)
Chloride: 99 mmol/L (ref 96–106)
Creatinine, Ser: 0.94 mg/dL (ref 0.57–1.00)
GFR calc Af Amer: 75 mL/min/{1.73_m2} (ref >59)
GFR calc non Af Amer: 65 mL/min/{1.73_m2} (ref >59)
Glucose: 188 mg/dL — ABNORMAL HIGH (ref 65–99)
Potassium: 4.2 mmol/L (ref 3.5–5.2)
Sodium: 138 mmol/L (ref 134–144)

## 2020-12-14 LAB — LIPID PANEL
Chol/HDL Ratio: 4.7 ratio — ABNORMAL HIGH (ref 0.0–4.4)
Cholesterol, Total: 232 mg/dL — ABNORMAL HIGH (ref 100–199)
HDL: 49 mg/dL (ref >39)
LDL Chol Calc (NIH): 157 mg/dL — ABNORMAL HIGH (ref 0–99)
Triglycerides: 145 mg/dL (ref 0–149)
VLDL Cholesterol Cal: 26 mg/dL (ref 5–40)

## 2021-01-17 ENCOUNTER — Other Ambulatory Visit: Payer: Self-pay

## 2021-01-17 ENCOUNTER — Ambulatory Visit (HOSPITAL_COMMUNITY)
Admission: EM | Admit: 2021-01-17 | Discharge: 2021-01-17 | Disposition: A | Discharge status: Home / Self Care | Payer: 59 | Attending: Family Medicine | Admitting: Family Medicine

## 2021-01-17 ENCOUNTER — Ambulatory Visit (INDEPENDENT_AMBULATORY_CARE_PROVIDER_SITE_OTHER): Payer: 59

## 2021-01-17 ENCOUNTER — Encounter (HOSPITAL_COMMUNITY): Payer: Self-pay | Admitting: Emergency Medicine

## 2021-01-17 DIAGNOSIS — M545 Low back pain, unspecified: Secondary | ICD-10-CM | POA: Diagnosis not present

## 2021-01-17 DIAGNOSIS — M25562 Pain in left knee: Secondary | ICD-10-CM

## 2021-01-17 DIAGNOSIS — W19XXXA Unspecified fall, initial encounter: Secondary | ICD-10-CM

## 2021-01-17 MED ORDER — CYCLOBENZAPRINE HCL 5 MG PO TABS: 5.0000 mg | ORAL_TABLET | Freq: Three times a day (TID) | ORAL | 0 refills | Status: DC | PRN

## 2021-01-17 MED ORDER — NAPROXEN 500 MG PO TABS: 500.0000 mg | ORAL_TABLET | Freq: Two times a day (BID) | ORAL | 0 refills | Status: DC | PRN

## 2021-01-17 NOTE — ED Provider Notes (Signed)
[place]    CSN: [id number] Arrival date & time: 01/17/21  1400      History   Chief Complaint Chief Complaint  Patient presents with  . Fall    HPI Mackenzie Nichols is a 65 y.o. female.   Here today with right hip and low back pain as well as left knee pain and swelling after slipping on the ice yesterday and landing onto right buttock. Denies radiation of pain down legs, numbness, tingling, swelling, bowel or bladder incontinence. Has not tried anything for pain thus far.      Past Medical History:  Diagnosis Date  . Borderline diabetes no meds  . Diabetes mellitus, type 2 ([place])   . Fibroid   . Hyperlipidemia   . Hypertension   . Obesity     Patient Active Problem List   Diagnosis Date Noted  . Epigastric pain 06/25/2019  . Conjunctivitis 03/02/2019  . Vaginal neoplasm benign 09/21/2015  . Bloody stools 08/09/2014  . Essential hypertension, benign 07/15/2014  . Diabetes mellitus, type 2 ([place]) 07/15/2014  . Fibroid uterus 09/01/2013  . Hypercholesterolemia 06/16/2013  . Morbid obesity with BMI of 40.0-44.9, adult ([place]) 09/17/2012    Past Surgical History:  Procedure Laterality Date  . DILATATION & CURETTAGE/HYSTEROSCOPY WITH MYOSURE [name] 07/09/2016   Procedure: DILATATION & CURETTAGE/HYSTEROSCOPY WITH MYOSURE;  Surgeon: Anastasio Auerbach, MD;  Location: [facility];  Service: Gynecology;  Laterality: [name];  request 7:30am start time  needs one hour  Needs LEEP machine available.  Marland Kitchen EXCISION VAGINAL CYST [name] 07/09/2016   Procedure: EXCISION VAGINAL MASS;  Surgeon: Anastasio Auerbach, MD;  Location: [facility];  Service: Gynecology;  Laterality: [name];  . HERNIA REPAIR    . WISDOM TOOTH EXTRACTION      OB History    Gravida  7   Para  6   Term  6   Preterm      AB  1   Living  6     SAB  1   IAB      Ectopic      Multiple      Live Births               Home Medications    Prior to Admission medications   Medication Sig Start  Date End Date Taking? Authorizing Provider  atorvastatin (LIPITOR) 40 MG tablet Take 1 tablet (40 mg total) by mouth daily. 11/22/20  Yes Benay Pike, MD  cyclobenzaprine (FLEXERIL) 5 MG tablet Take 1 tablet (5 mg total) by mouth 3 (three) times daily as needed for muscle spasms. DO NOT DRINK ALCOHOL OR DRIVE WHILE TAKING THIS MEDICATION 01/17/21  Yes Volney American, PA-C  losartan (COZAAR) 25 MG tablet Take 1 tablet (25 mg total) by mouth at bedtime. 11/22/20  Yes Benay Pike, MD  metFORMIN (GLUCOPHAGE-XR) 500 MG 24 hr tablet Take 1 tablet (500 mg total) by mouth 2 (two) times daily. 12/13/20  Yes Ganta, Anupa, DO  naproxen (NAPROSYN) 500 MG tablet Take 1 tablet (500 mg total) by mouth 2 (two) times daily as needed. 01/17/21  Yes Volney American, PA-C  amLODipine (NORVASC) 5 MG tablet TAKE 1 TABLET BY MOUTH EVERY DAY Patient not taking: No sig reported 04/06/18   Janora Norlander, DO  famotidine (PEPCID) 40 MG tablet Take 1 tablet (40 mg total) by mouth daily as needed for heartburn or indigestion. Patient not taking: No sig reported 06/25/19   Maia Breslow  C, MD  ibuprofen (ADVIL,MOTRIN) 600 MG tablet Take 1 tablet (600 mg total) by mouth every 6 (six) hours as needed. Patient not taking: No sig reported 09/05/16   Ward, Ozella Almond, PA-C    Family History Family History  Problem Relation Age of Onset  . Diabetes Mother   . Hypertension Mother   . Diabetes Father   . Heart disease Father   . Prostate cancer Father   . Hypertension Father   . Diabetes Sister   . Hypertension Sister   . Kidney disease Sister   . Epilepsy Daughter   . Hyperthyroidism Daughter   . Epilepsy Son   . Hypertension Sister   . Cancer Sister   . Hypertension Sister   . Colon cancer Neg Hx   . Colon polyps Neg Hx   . Esophageal cancer Neg Hx   . Gallbladder disease Neg Hx     Social History Social History   Tobacco Use  . Smoking status: Never Smoker  . Smokeless tobacco: Never  Used  Substance Use Topics  . Alcohol use: No    Alcohol/week: 0.0 standard drinks  . Drug use: No     Allergies   Clindamycin/lincomycin   Review of Systems Review of Systems PER HPI    Physical Exam Triage Vital Signs ED Triage Vitals  Enc Vitals Group     BP 01/17/21 1527 (!) 160/78     Pulse Rate 01/17/21 1527 86     Resp 01/17/21 1527 20     Temp 01/17/21 1527 98.3 F (36.8 C)     Temp Source 01/17/21 1527 Oral     SpO2 01/17/21 1527 96 %     Weight 01/17/21 1530 236 lb (107 kg)     Height 01/17/21 1530 5\' 2"  (1.575 m)     Head Circumference --      Peak Flow --      Pain Score 01/17/21 1529 8     Pain Loc --      Pain Edu? --      Excl. in [place]? --    No data found.  Updated Vital Signs BP (!) 160/78 (BP Location: Left Arm)   Pulse 86   Temp 98.3 F (36.8 C) (Oral)   Resp 20   Ht 5\' 2"  (1.575 m)   Wt 236 lb (107 kg)   LMP 06/15/2013   SpO2 96%   BMI 43.16 kg/m   Visual Acuity Right Eye Distance:   Left Eye Distance:   Bilateral Distance:    Right Eye Near:   Left Eye Near:    Bilateral Near:     Physical Exam Vitals and nursing note reviewed.  Constitutional:      Appearance: Normal appearance. She is not ill-appearing.  HENT:     Head: Atraumatic.  Eyes:     Extraocular Movements: Extraocular movements intact.     Conjunctiva/sclera: Conjunctivae normal.  Cardiovascular:     Rate and Rhythm: Normal rate and regular rhythm.     Pulses: Normal pulses.     Heart sounds: Normal heart sounds.  Pulmonary:     Effort: Pulmonary effort is normal. No respiratory distress.     Breath sounds: Normal breath sounds. No wheezing or rales.  Abdominal:     General: Bowel sounds are normal. There is no distension.     Palpations: Abdomen is soft.     Tenderness: There is no abdominal tenderness. There is no guarding.  Musculoskeletal:  General: Tenderness (trace edema and diffuse mild ttp left knee, worst anteriorly) present. No deformity  or signs of injury. Normal range of motion.     Cervical back: Normal range of motion and neck supple.     Comments: No crepitus or stiffness to PROM left knee  ttp right buttock and low back laterally No midline lumbar ttp - SLR b/l  Skin:    General: Skin is warm and dry.     Findings: No bruising or erythema.  Neurological:     Mental Status: She is alert and oriented to person, place, and time.     Sensory: No sensory deficit.  Psychiatric:        Mood and Affect: Mood normal.        Thought Content: Thought content normal.        Judgment: Judgment normal.      UC Treatments / Results  Labs (all labs ordered are listed, but only abnormal results are displayed) Labs Reviewed - No data to display  EKG   Radiology DG Lumbar Spine Complete  Result Date: 01/17/2021 CLINICAL DATA:  Low back soreness after fall. EXAM: LUMBAR SPINE - COMPLETE 4+ VIEW COMPARISON:  CT AP 3/5/8 FINDINGS: Mild curvature of the lumbar spine is convex towards the left. There is a anterolisthesis L4 on L5 measuring 6 mm. The vertebral body heights are well preserved. No fracture identified. Bilateral facet arthropathy is noted at the L4-5 and L5-S1 level. Calcified uterine fibroid projects over the left hemipelvis. IMPRESSION: 1. No acute findings. 2. Anterolisthesis of L4 on L5. 3. Bilateral facet arthropathy at L4-5 and L5-S1. 4. Calcified uterine fibroid. Electronically Signed   By: Signa Kell M.D.   On: 01/17/2021 16:47   DG Knee Complete 4 Views Left  Result Date: 01/17/2021 CLINICAL DATA:  65 year old female with left knee pain. EXAM: LEFT KNEE - COMPLETE 4+ VIEW COMPARISON:  None. FINDINGS: There is no acute fracture or dislocation. Mild narrowing of the medial compartment. There is a small suprapatellar effusion. The soft tissues are unremarkable. IMPRESSION: 1. No acute fracture or dislocation. 2. Small suprapatellar effusion. Electronically Signed   By: Elgie Collard M.D.   On: 01/17/2021  16:47    Procedures Procedures (including critical care time)  Medications Ordered in UC Medications - No data to display  Initial Impression / Assessment and Plan / UC Course  I have reviewed the triage vital signs and the nursing notes.  Pertinent labs & imaging results that were available during my care of the patient were reviewed by me and considered in my medical decision making (see chart for details).     Patient requesting x-rays, both negative for acute bony injuries. Treat with naproxen and flexeril, heat, rest. Work note given. Return for acutely worsening sxs.   Final Clinical Impressions(s) / UC Diagnoses   Final diagnoses:  Acute right-sided low back pain without sciatica  Acute pain of left knee  Fall, initial encounter   Discharge Instructions   None    ED Prescriptions    Medication Sig Dispense Auth. Provider   naproxen (NAPROSYN) 500 MG tablet Take 1 tablet (500 mg total) by mouth 2 (two) times daily as needed. 30 tablet Particia Nearing, [place]   cyclobenzaprine (FLEXERIL) 5 MG tablet Take 1 tablet (5 mg total) by mouth 3 (three) times daily as needed for muscle spasms. DO NOT DRINK ALCOHOL OR DRIVE WHILE TAKING THIS MEDICATION 15 tablet Particia Nearing, [place]  PDMP not reviewed this encounter.   Volney American, [place] 01/17/21 1948

## 2021-01-17 NOTE — ED Triage Notes (Signed)
Pt presents today with c/o of fall that occurred last night and work. She slipped on ice as she was exiting building. Complains of left knee, lower back and lower back. Pain 8/10.

## 2021-01-18 ENCOUNTER — Ambulatory Visit: Payer: 59

## 2021-01-18 NOTE — Progress Notes (Signed)
    SUBJECTIVE:   CHIEF COMPLAINT / HPI:   Fall with back pain  Patient seen in urgent care on 2/1 Fell on ice onto her bottom Had x-ray of lumbar spine and left knee Showed no acute findings, does have anterior listhesis of L4 on L5 and bilateral facet arthropathy at L4-5 and L5-S1 Left knee x-ray without acute fracture, does have small suprapatellar effusion She was prescribed Flexeril 5 mg 3 times daily as needed and naproxen 500 mg twice daily as needed She is still having back pain that is the same, no worse She is taking naproxen which is helping Was told to f/u Knee pain is also improving with the medication No saddle anesthesia, changes in bowel or bladder habits, weakness in lower extremities 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 female in NAD Lungs: Breathing comfortably on room air Skin: warm and dry   Lumbar spine: - Inspection: no gross deformity or asymmetry, swelling or ecchymosis - Palpation: Tender to palpation over bilateral paraspinal musculature and sacrum - ROM: Range of motion limited in both flexion and extension secondary to pain - Strength: 5/5 strength of lower extremity in L4-S1 nerve root distributions b/l; normal gait - Neuro: sensation intact in the L4-S1 nerve root distribution b/l, 2+ L4 and S1 reflexes - Special testing: Negative straight leg raise b/l   ASSESSMENT/PLAN:   Acute bilateral low back pain without sciatica Status post fall on ice.  Seen in urgent care and had negative  x-rays.  She has not had any worsening of her pain and does not have any red flag symptoms that suggest neurologic involvement.  She has improvement with naproxen, advised her that she can continue with this.  Verified that she has not had a true GI bleed, only a history of hemorrhoids.  Advised her to take naproxen with food.  If she runs out, advised that she can switch to ibuprofen 600 mg 3 times daily as needed, but do not use those together.  Also advised using heat on her back to help with pain.  Given her rather physically demanding job, will go ahead and write her off for 1 week, she is not due to work next week and, therefore we will have her follow-up on 2/14 and at that time can determine her ability to return to work.  Return to care if worsening or signs of neurologic involvement immediately.     Cleophas Dunker, [place]

## 2021-01-18 NOTE — Progress Notes (Deleted)
    SUBJECTIVE:   CHIEF COMPLAINT / HPI:   ***  PERTINENT  PMH / PSH: Hypertension, T2DM, elevated BMI  OBJECTIVE:   LMP 06/15/2013   ***  ASSESSMENT/PLAN:   No problem-specific Assessment & Plan notes found for this encounter.     Patriciaann Clan, [place]

## 2021-01-19 ENCOUNTER — Other Ambulatory Visit: Payer: Self-pay

## 2021-01-19 ENCOUNTER — Ambulatory Visit: Payer: 59 | Admitting: Family Medicine

## 2021-01-19 VITALS — BP 120/82 | HR 77 | Wt 232.2 lb

## 2021-01-19 DIAGNOSIS — M545 Low back pain, unspecified: Secondary | ICD-10-CM

## 2021-01-19 NOTE — Assessment & Plan Note (Signed)
Status post fall on ice.  Seen in urgent care and had negative x-rays.  She has not had any worsening of her pain and does not have any red flag symptoms that suggest neurologic involvement.  She has improvement with naproxen, advised her that she can continue with this.  Verified that she has not had a true GI bleed, only a history of hemorrhoids.  Advised her to take naproxen with food.  If she runs out, advised that she can switch to ibuprofen 600 mg 3 times daily as needed, but do not use those together.  Also advised using heat on her back to help with pain.  Given her rather physically demanding job, will go ahead and write her off for 1 week, she is not due to work next week and, therefore we will have her follow-up on 2/14 and at that time can determine her ability to return to work.  Return to care if worsening or signs of neurologic involvement immediately.

## 2021-01-19 NOTE — Patient Instructions (Signed)
Thank you for coming to see me today. It was a pleasure. Today we talked about:   You can continue to take naproxen.  If you run out you can change to ibuprofen 600mg  three times a day as needed for pain.  Use heat on your back.  Please follow-up with me or PCP in 1 week (on 2/14).  If you have any questions or concerns, please do not hesitate to call the office at [phone].  Best,   Arizona Constable, DO

## 2021-01-29 ENCOUNTER — Other Ambulatory Visit: Payer: Self-pay | Admitting: Family Medicine

## 2021-01-31 ENCOUNTER — Ambulatory Visit: Payer: 59 | Admitting: Family Medicine

## 2021-01-31 NOTE — Progress Notes (Deleted)
     SUBJECTIVE:   CHIEF COMPLAINT / HPI:   Mackenzie Nichols is a 65 y.o. female presents with for follow up for back pain  Back pain  Presents for follow up for back pain. S/p fall on ice, recent xrays of *** negative for fractures. Seen in clinic on 2/4 and treated with naproxen and heat pad.  Since then patient reports improvement in symptoms **. Associated symptoms include **.   PERTINENT  PMH / PSH: T2DM, HTN, HLD  OBJECTIVE:   LMP 06/15/2013    General: Alert, no acute distress Cardio: Normal S1 and S2, RRR, no r/m/g Pulm: CTAB, normal work of breathing Abdomen: Bowel sounds normal. Abdomen soft and non-tender.  Extremities: No peripheral edema.  Neuro: Cranial nerves grossly intact   Back Normal skin, Spine with normal alignment and no deformity.  No tenderness to vertebral process palpation.  Paraspinous muscles are not tender and without spasm.   Range of motion is full at neck and lumbar sacral regions. Negative straight leg test.  ASSESSMENT/PLAN:   No problem-specific Assessment & Plan notes found for this encounter.     Lattie Haw, MD PGY-2 [place]

## 2021-03-05 ENCOUNTER — Other Ambulatory Visit: Payer: Self-pay | Admitting: Family Medicine

## 2021-03-05 DIAGNOSIS — E7800 Pure hypercholesterolemia, unspecified: Secondary | ICD-10-CM

## 2021-03-10 ENCOUNTER — Other Ambulatory Visit: Payer: Self-pay | Admitting: Family Medicine

## 2021-03-10 DIAGNOSIS — E119 Type 2 diabetes mellitus without complications: Secondary | ICD-10-CM

## 2021-05-23 ENCOUNTER — Ambulatory Visit (INDEPENDENT_AMBULATORY_CARE_PROVIDER_SITE_OTHER): Payer: 59

## 2021-05-23 ENCOUNTER — Other Ambulatory Visit: Payer: Self-pay

## 2021-05-23 DIAGNOSIS — Z23 Encounter for immunization: Secondary | ICD-10-CM

## 2021-06-07 ENCOUNTER — Ambulatory Visit: Payer: 59 | Admitting: Family Medicine

## 2021-06-11 ENCOUNTER — Other Ambulatory Visit: Payer: Self-pay

## 2021-06-11 ENCOUNTER — Ambulatory Visit: Payer: 59 | Admitting: Family Medicine

## 2021-06-11 VITALS — BP 138/85 | HR 86 | Ht 62.0 in | Wt 230.6 lb

## 2021-06-11 DIAGNOSIS — I10 Essential (primary) hypertension: Secondary | ICD-10-CM | POA: Diagnosis not present

## 2021-06-11 DIAGNOSIS — E119 Type 2 diabetes mellitus without complications: Secondary | ICD-10-CM

## 2021-06-11 DIAGNOSIS — H04129 Dry eye syndrome of unspecified lacrimal gland: Secondary | ICD-10-CM | POA: Insufficient documentation

## 2021-06-11 MED ORDER — CARBOXYMETHYLCELLULOSE SODIUM 1 % OP SOLN: 1.0000 [drp] | Freq: Three times a day (TID) | OPHTHALMIC | 12 refills | Status: DC

## 2021-06-11 NOTE — Progress Notes (Signed)
    SUBJECTIVE:   CHIEF COMPLAINT / HPI:   Watering Eye Right eye Started last week Some occasional discharge, not matted shut in AM No fevers, chills, cough, congestion, rhinorrhea, history of allergies, history of eye trauma, blurry vision  No eye pain Does itch a little Getting better Used husband's dry eye drops once last week and it helped She just didn't know if she should keep using it, so only used once Never had anything like this before No contact wearing, has glasses   T2DM with HLD Current regimen: Metformin 500 mg twice daily Last A1c 11/22/2020, 7.8 Last lipid panel 12/13/2020, LDL 157 on Lipitor 40 mg daily, reports compliance  HTN Current regimen: Losartan 25 mg daily, Norvasc 5 mg daily Last BMP 12/13/2020, WNL aside from mild hyperglycemia Reports compliance with all meds  PERTINENT  PMH / PSH: HTN, T2DM, obesity, HLD  OBJECTIVE:   BP 138/85   Pulse 86   Ht 5\' 2"  (1.575 m)   Wt 230 lb 9.6 oz (104.6 kg)   LMP 06/15/2013   SpO2 96%   BMI 42.18 kg/m    Physical Exam:  General: 65 y.o. female in NAD HEENT: NCAT, bilateral sclera clear, no conjunctival injection, excessive tearing, or discharge, bilateral upper and lower eyelids without abnormality, PERRL Lungs: Breathing comfortably on room air Skin: warm and dry Extremities: Ambulating without difficulty   ASSESSMENT/PLAN:   Dry eye Discussed with patient that eyes do not appear to be infected today.  Could consider that she had a viral conjunctivitis that is now resolved.  Does not seem to have signs or symptoms of allergic conjunctivitis.  Most likely has some slight dry eye of the right eye.  We will treat with refresh eyedrops, or other over-the-counter dry eyedrops that does not include antiredness.  She will follow-up in 2 weeks if no improvement in her symptoms.  Diabetes mellitus, type 2 Assessed compliance with her current medication.  Scheduled for follow-up appointment on 7/13 with PCP.   Can perform A1c at that time.  Essential hypertension, benign Assess compliance of medication.  BP today slightly above goal.  Follow-up on 7/13 with PCP.     Cleophas Dunker, [place]

## 2021-06-11 NOTE — Patient Instructions (Signed)
Thank you for coming to see me today. It was a pleasure. Today we talked about:   Use dry eye drops (no anti-redness) up to three times a day.  If not improving in the next two weeks, please let us know.  Please follow-up with PCP in 2 weeks for your diabetes.  If you have any questions or concerns, please do not hesitate to call the office at [phone].  Best,   Ari[place] Constable, DO

## 2021-06-11 NOTE — Assessment & Plan Note (Signed)
Discussed with patient that eyes do not appear to be infected today.  Could consider that she had a viral conjunctivitis that is now resolved.  Does not seem to have signs or symptoms of allergic conjunctivitis.  Most likely has some slight dry eye of the right eye.  We will treat with refresh eyedrops, or other over-the-counter dry eyedrops that does not include antiredness.  She will follow-up in 2 weeks if no improvement in her symptoms.

## 2021-06-11 NOTE — Assessment & Plan Note (Addendum)
Assess compliance of medication.  BP today slightly above goal.  Follow-up on 7/13 with PCP.

## 2021-06-11 NOTE — Assessment & Plan Note (Signed)
Assessed compliance with her current medication.  Scheduled for follow-up appointment on 7/13 with PCP.  Can perform A1c at that time.

## 2021-06-26 NOTE — Progress Notes (Deleted)
     SUBJECTIVE:   CHIEF COMPLAINT / HPI:   Mackenzie Nichols is a 65 y.o. female presents for diabetes   Diabetes Patient's current diabetic medications include metformin. Tolerating well without side effects.  Patient endorses compliance with these medications. CBG readings averaging in the *** range.  Patient's last A1c was  Lab Results  Component Value Date   HGBA1C 7.8 (A) 11/22/2020   HGBA1C 7.4 (A) 06/25/2019   HGBA1C 7.0 03/02/2019   on **.  Current A1c today is***.  Denies abdominal pain, blurred vision, polyuria, polydipsia, hypoglycemia ***. Patient states they understand that diet and exercise can help with her diabetes.***.    Last Microalbumin, LDL, Creatinine: Lab Results  Component Value Date   LDLCALC 157 (H) 12/13/2020   CREATININE 0.94 12/13/2020     Hypertension: Patient's current antihypertensive  medications include: amlodipine. Compliant with medications and tolerating well without side effects.  Checking BP at home with readings between *** and **.  Denies any SOB, CP, vision changes, LE edema, medication SEs, or symptoms of hypotension.   Most recent creatinine trend:  Lab Results  Component Value Date   CREATININE 0.94 12/13/2020   CREATININE 0.91 06/25/2019   CREATININE 0.79 04/15/2018     Patient {HAS HAS YSA:63016} had a BMP in the past 1 year.           ***  [facility] Office Visit from 01/19/2021 in [facility]  PHQ-9 Total Score 0        Health Maintenance Due  Topic   PNEUMOCOCCAL POLYSACCHARIDE VACCINE AGE 74-64 HIGH RISK    OPHTHALMOLOGY EXAM    Zoster Vaccines- Shingrix (1 of 2)   PAP SMEAR-Modifier    HEMOGLOBIN A1C       PERTINENT  PMH / PSH:   OBJECTIVE:   LMP 06/15/2013    General: Alert, no acute distress Cardio: Normal S1 and S2, RRR, no r/m/g Pulm: CTAB, normal work of breathing Abdomen: Bowel sounds normal. Abdomen soft and non-tender.  Extremities: No peripheral edema.   Neuro: Cranial nerves grossly intact   ASSESSMENT/PLAN:   No problem-specific Assessment & Plan notes found for this encounter.     Lattie Haw, MD PGY-2 [place]

## 2021-06-27 ENCOUNTER — Ambulatory Visit: Payer: 59 | Admitting: Family Medicine

## 2021-07-09 ENCOUNTER — Other Ambulatory Visit: Payer: Self-pay | Admitting: Family Medicine

## 2021-07-09 DIAGNOSIS — E119 Type 2 diabetes mellitus without complications: Secondary | ICD-10-CM

## 2021-08-13 ENCOUNTER — Ambulatory Visit: Payer: 59 | Admitting: Family Medicine

## 2021-08-27 ENCOUNTER — Other Ambulatory Visit: Payer: Self-pay

## 2021-08-27 ENCOUNTER — Ambulatory Visit: Payer: 59 | Admitting: Student

## 2021-08-27 ENCOUNTER — Encounter: Payer: Self-pay | Admitting: Student

## 2021-08-27 VITALS — BP 149/89 | HR 76 | Ht 62.0 in | Wt 235.2 lb

## 2021-08-27 DIAGNOSIS — E119 Type 2 diabetes mellitus without complications: Secondary | ICD-10-CM

## 2021-08-27 DIAGNOSIS — Z0000 Encounter for general adult medical examination without abnormal findings: Secondary | ICD-10-CM | POA: Diagnosis not present

## 2021-08-27 DIAGNOSIS — I10 Essential (primary) hypertension: Secondary | ICD-10-CM

## 2021-08-27 LAB — POCT GLYCOSYLATED HEMOGLOBIN (HGB A1C): HbA1c, POC (controlled diabetic range): 7.6 % — AB (ref 0.0–7.0)

## 2021-08-27 NOTE — Assessment & Plan Note (Signed)
Blood pressure 149/89 today.  Blood pressure is 130 over 80s according to patient.  Compliant with medications.  Denies headaches, chest pain, dyspnea on exertion, palpitations.  Advised blood pressure log and return in 1 month.

## 2021-08-27 NOTE — Assessment & Plan Note (Addendum)
A1c improved from 7.8 to 7.6 today.  Patient compliant with medications.  Encouraged healthy diet and frequent exercise.  Due for eye exam, discussed with patient.

## 2021-08-27 NOTE — Assessment & Plan Note (Signed)
Patient is due for flu, pneumococcal, shingles vaccines.  Declined at this time.  Also due for Pap smear.  Advised patient return within 1 month for Pap and vaccines, if interested. Patient was amenable to plan. Given [facility] number to call for colonoscopy appointment as patient believed she was due after 5 years (last received in 2016).

## 2021-08-27 NOTE — Progress Notes (Signed)
  SUBJECTIVE:   CHIEF COMPLAINT / HPI:   T2DM w/ HLD Current regimen: Metformin 500 mg twice daily Last A1c 11/22/2020: 7.8  HTN: Blood pressure 149/89 today.  Patient denied headaches, chest pain, dyspnea on exertion, palpitations. Pt states she checks it at home and it runs in the 130s/80s but she has not checked it recently.   PERTINENT  PMH / PSH: HTN, T2DM, obesity, HLD  OBJECTIVE:   BP (!) 149/89   Pulse 76   Ht '5\' 2"'$  (1.575 m)   Wt 235 lb 4 oz (106.7 kg)   LMP 06/15/2013   SpO2 98%   BMI 43.03 kg/m    General: NAD, pleasant, able to participate in exam Cardiac: RRR, no murmurs. Respiratory: CTAB, normal effort, No wheezes, rales or rhonchi  ASSESSMENT/PLAN:   Essential hypertension, benign Blood pressure 149/89 today.  Blood pressure is 130 over 80s according to patient.  Compliant with medications.  Denies headaches, chest pain, dyspnea on exertion, palpitations.  Advised blood pressure log and return in 1 month.  Diabetes mellitus, type 2 A1c improved from 7.8 to 7.6 today.  Patient compliant with medications.  Encouraged healthy diet and frequent exercise.  Due for eye exam, discussed with patient.   Healthcare maintenance Patient is due for flu, pneumococcal, shingles vaccines.  Declined at this time.  Also due for Pap smear.  Advised patient return within 1 month for Pap and vaccines, if interested. Patient was amenable to plan. Given [facility] number to call for colonoscopy appointment as patient believed she was due after 5 years (last received in 2016).      Wells Guiles, [place]

## 2021-08-27 NOTE — Patient Instructions (Addendum)
It was great to see you today! Thank you for choosing [facility] for your primary care. Mackenzie Nichols was seen for follow-up A1c.  Our plans for today were:  -Recheck A1c. It was 7.6, improved from 7.8 previously. Well done! You are due for your annual eye exam.  Please set this up with your ophthalmologist. -Your blood pressure is a bit high at 149/89 today.  Please check this daily at home while you are resting and in good state of mind.  Please write these numbers down and return to clinic in 1 month with this log. -On your follow-up visit, I can address Pap smear and any vaccinations that you would like to receive.  You are due for flu, pneumococcal, shingles. -The phone number for gastroenterology this [phone]. Please give them a call regarding your colonoscopy. It was last done in 2016 and I am unable to see whether you are due for another in 5 or 10 years.   We are checking some labs today. If they are abnormal, I will call you. If they are normal, I will send you a MyChart message or a letter. If you do not hear about your labs in the next 2 weeks, please let us know.  You should return to our clinic in 1 month for Pap and HTN follow up.   Please arrive 15 minutes before your appointment to ensure smooth check in process.  We appreciate your efforts in making this happen.  Take care and seek immediate care sooner if you develop any concerns.   Thank you for allowing me to participate in your care, Wells Guiles, DO 08/27/2021, 11:19 AM PGY-1, [place]

## 2021-09-21 ENCOUNTER — Encounter: Payer: Self-pay | Admitting: Family Medicine

## 2021-09-21 ENCOUNTER — Other Ambulatory Visit (HOSPITAL_COMMUNITY)
Admission: RE | Admit: 2021-09-21 | Discharge: 2021-09-21 | Disposition: A | Discharge status: Home / Self Care | Payer: 59 | Source: Ambulatory Visit | Attending: Family Medicine | Admitting: Family Medicine

## 2021-09-21 ENCOUNTER — Ambulatory Visit: Payer: 59 | Admitting: Family Medicine

## 2021-09-21 ENCOUNTER — Other Ambulatory Visit: Payer: Self-pay

## 2021-09-21 VITALS — BP 150/70 | HR 77 | Ht 62.0 in | Wt 233.0 lb

## 2021-09-21 DIAGNOSIS — Z1231 Encounter for screening mammogram for malignant neoplasm of breast: Secondary | ICD-10-CM

## 2021-09-21 DIAGNOSIS — Z113 Encounter for screening for infections with a predominantly sexual mode of transmission: Secondary | ICD-10-CM | POA: Diagnosis not present

## 2021-09-21 DIAGNOSIS — E119 Type 2 diabetes mellitus without complications: Secondary | ICD-10-CM

## 2021-09-21 DIAGNOSIS — Z124 Encounter for screening for malignant neoplasm of cervix: Secondary | ICD-10-CM | POA: Diagnosis not present

## 2021-09-21 DIAGNOSIS — Z0000 Encounter for general adult medical examination without abnormal findings: Secondary | ICD-10-CM | POA: Diagnosis not present

## 2021-09-21 NOTE — Progress Notes (Signed)
    SUBJECTIVE:   CHIEF COMPLAINT / HPI:   PAP smear - last PAP 01/10/2016 NILM - no vaginal complaints - denies abnormal vaginal discharge, pruritis, or lesions - patient amenable to add-on vaginal swab STI screening, but declines blood draw for HIV, RPR  PERTINENT  PMH / PSH: HTN, T2DM, fibroid uterus, HLD, morbid obesity  OBJECTIVE:   BP (!) 150/70   Pulse 77   Ht 5\' 2"  (1.575 m)   Wt 233 lb (105.7 kg)   LMP 06/15/2013   SpO2 97%   BMI 42.62 kg/m   Physical Exam General: Awake, alert, oriented, no acute distress Respiratory: Normal work of breathing, no respiratory distress Neuro: Cranial nerves II through X grossly intact, able to move all extremities spontaneously Vulva: Normal appearing vulva without rashes, lesions, or deformities Vagina: Pale pink rugated vaginal tissue without obvious lesions, physiologic discharge of whitish color, cervix without lesion or overt tenderness with swab  ASSESSMENT/PLAN:   Healthcare maintenance PAP smear and STI screening for GC/CH and trich performed today. Will update patient on results. Also ordered mammogram and provided instructions on scheduling with [facility].   Diabetes mellitus, type 2 Patient due for annual ophthalmology exam, referral placed.      Ezequiel Essex, MD [place]

## 2021-09-21 NOTE — Patient Instructions (Signed)
It was wonderful to meet you today. Thank you for allowing me to be a part of your care. Below is a short summary of what we discussed at your visit today:  PAP smear If the results are normal, I will send you a letter or MyChart message. If the results are abnormal, I will give you a call.    STI screening Today we collected a vaginal swab that we will test for gonorrhea, chlamydia, and trichomonas.  As above, if the results are normal, I will send you a letter or MyChart message. If the results are abnormal, I will give you a call.    Health Maintenance We like to think about ways to keep you healthy for years to come. Below are some interventions and screenings we can offer to keep you healthy: -Mammogram to screen for breast cancer (simply call the [facility] breast center directly for an appointment) -Annual diabetic eye exam with an ophthalmologist (I have sent in the referral, their office should be calling you directly for an appointment)    Please bring all of your medications to every appointment!  If you have any questions or concerns, please do not hesitate to contact us via phone or MyChart message.   Ezequiel Essex, MD

## 2021-09-23 NOTE — Assessment & Plan Note (Signed)
Patient due for annual ophthalmology exam, referral placed.

## 2021-09-23 NOTE — Assessment & Plan Note (Signed)
PAP smear and STI screening for GC/CH and trich performed today. Will update patient on results. Also ordered mammogram and provided instructions on scheduling with [facility].

## 2021-09-24 LAB — CYTOLOGY - PAP
Adequacy: ABSENT
Chlamydia: NEGATIVE
Comment: NEGATIVE
Comment: NEGATIVE
Comment: NORMAL
Diagnosis: NEGATIVE
Neisseria Gonorrhea: NEGATIVE
Trichomonas: NEGATIVE

## 2021-10-12 ENCOUNTER — Other Ambulatory Visit: Payer: Self-pay | Admitting: Family Medicine

## 2021-10-12 DIAGNOSIS — E119 Type 2 diabetes mellitus without complications: Secondary | ICD-10-CM

## 2021-12-13 ENCOUNTER — Other Ambulatory Visit: Payer: Self-pay | Admitting: Family Medicine

## 2021-12-14 NOTE — Telephone Encounter (Signed)
Notes to clinic:  prescriber not in this practice.  Requested Prescriptions  Pending Prescriptions Disp Refills   naproxen (NAPROSYN) 500 MG tablet [Pharmacy Med Name: NAPROXEN 500MG  TABLETS] 30 tablet 0    Sig: TAKE 1 TABLET(500 MG) BY MOUTH TWICE DAILY AS NEEDED     Analgesics:  NSAIDS Failed - 12/13/2021  7:48 PM      Failed - Cr in normal range and within 360 days    Creat  Date Value Ref Range Status  02/21/2017 0.89 0.50 - 1.05 mg/dL Final    Comment:      For patients > or = 63 years of age: The upper reference limit for Creatinine is approximately 13% higher for people identified as African-American.      Creatinine, Ser  Date Value Ref Range Status  12/13/2020 0.94 0.57 - 1.00 mg/dL Final          Failed - HGB in normal range and within 360 days    Hemoglobin  Date Value Ref Range Status  04/15/2018 12.9 11.1 - 15.9 g/dL Final          Failed - Valid encounter within last 12 months    Recent Outpatient Visits   None            Passed - Patient is not pregnant

## 2022-01-10 ENCOUNTER — Other Ambulatory Visit: Payer: Self-pay | Admitting: Family Medicine

## 2022-01-10 DIAGNOSIS — E119 Type 2 diabetes mellitus without complications: Secondary | ICD-10-CM

## 2022-01-11 ENCOUNTER — Other Ambulatory Visit: Payer: Self-pay | Admitting: *Deleted

## 2022-01-11 MED ORDER — LOSARTAN POTASSIUM 25 MG PO TABS: 25.0000 mg | ORAL_TABLET | Freq: Every day | ORAL | 3 refills | Status: DC

## 2022-03-19 ENCOUNTER — Other Ambulatory Visit: Payer: Self-pay | Admitting: Family Medicine

## 2022-03-19 DIAGNOSIS — E7800 Pure hypercholesterolemia, unspecified: Secondary | ICD-10-CM

## 2022-03-26 ENCOUNTER — Encounter: Payer: 59 | Admitting: Family Medicine

## 2022-03-26 NOTE — Progress Notes (Deleted)
? ? ?  SUBJECTIVE:  ? ?Chief compliant/HPI: annual examination ? ?Mackenzie Nichols is a 66 y.o. who presents today for an annual exam.  ? ? ?Diabetes ?Patient's current diabetic medications include metformin. Tolerating well without side effects.  Patient endorses compliance with these medications. CBG readings averaging in the *** range.  Patient's last A1c was  ?Lab Results  ?Component Value Date  ? HGBA1C 7.6 (A) 08/27/2021  ? HGBA1C 7.8 (A) 11/22/2020  ? HGBA1C 7.4 (A) 06/25/2019  ? on **.  Current A1c today is***.  Denies abdominal pain, blurred vision, polyuria, polydipsia, hypoglycemia ***. Patient states they understand that diet and exercise can help with her diabetes.***.   ? ?Last Microalbumin, LDL, Creatinine: ?Lab Results  ?Component Value Date  ? LDLCALC 157 (H) 12/13/2020  ? CREATININE 0.94 12/13/2020  ? ? ? ?Hypertension: ?Patient's current antihypertensive  medications include: losartan, amlodipine Compliant with medications and tolerating well without side effects.  Checking BP at home with readings between *** and **.  Denies any SOB, CP, vision changes, LE edema, medication SEs, or symptoms of hypotension.  ? ?Most recent creatinine trend:  ?Lab Results  ?Component Value Date  ? CREATININE 0.94 12/13/2020  ? CREATININE 0.91 06/25/2019  ? CREATININE 0.79 04/15/2018  ?  ? ?Patient has not had a BMP in the past 1 year. ? ? ? ?HLD ?Patient is currently taking atorvastatin. Endorses compliance and tolerating well without side effects. Denies RUQ pain or myalgias. ? ? ? ? ? ?History tabs reviewed and updated ***.  ? ?Review of systems form reviewed and notable for ***.  ? ?OBJECTIVE:  ? ?LMP 06/15/2013   ?*** ? ?ASSESSMENT/PLAN:  ? ?No problem-specific Assessment & Plan notes found for this encounter. ?  ? ?Annual Examination  ?See AVS for age appropriate recommendations  ?PHQ score ***, reviewed and discussed.  ?BP reviewed and at goal ***.  ?Asked about intimate partner violence and resources given as  appropriate  ?Advance directives discussion *** ? ?Considered the following items based upon USPSTF recommendations: ?Diabetes screening: {discussed/ordered:14545} ?Screening for elevated cholesterol: {discussed/ordered:14545} ?HIV testing: {discussed/ordered:14545} ?Hepatitis C: {discussed/ordered:14545} ?Hepatitis B: {discussed/ordered:14545} ?Syphilis if at high risk: {discussed/ordered:14545} ?GC/CT {GC/CT screening :23818} ?Osteoporosis screening considered based upon risk of fracture from [facility] calculator. Major osteoporotic fracture risk is ***%. DEXA {ordered not order:23822}.  ?Reviewed risk factors for latent tuberculosis and {not indicated/requested/declined:14582} ? ? ?Discussed family history, BRCA testing {not indicated/requested/declined:14582}. Tool used to risk stratify was ***.  ?Cervical cancer screening: {PAPTYPE:23819} ?Breast cancer screening: {mammoscreen:23820} ?Colorectal cancer screening: {crcscreen:23821::"discussed, colonoscopy ordered"} ?Lung cancer screening: {discussed/declined/written VZCH:88502}. See documentation below regarding indications/risks/benefits.  ?Vaccinations ***.  ? ?Follow up in 1 *** year or sooner if indicated.  ? ? ?Lattie Haw, MD ?[place]   ?

## 2022-03-29 ENCOUNTER — Ambulatory Visit (INDEPENDENT_AMBULATORY_CARE_PROVIDER_SITE_OTHER): Payer: Self-pay | Admitting: Family Medicine

## 2022-03-29 VITALS — BP 137/71 | HR 76 | Temp 98.5°F | Resp 16 | Ht 62.0 in | Wt 229.4 lb

## 2022-03-29 DIAGNOSIS — I10 Essential (primary) hypertension: Secondary | ICD-10-CM

## 2022-03-29 DIAGNOSIS — E7800 Pure hypercholesterolemia, unspecified: Secondary | ICD-10-CM

## 2022-03-29 DIAGNOSIS — E119 Type 2 diabetes mellitus without complications: Secondary | ICD-10-CM

## 2022-03-29 DIAGNOSIS — E785 Hyperlipidemia, unspecified: Secondary | ICD-10-CM

## 2022-03-29 LAB — POCT GLYCOSYLATED HEMOGLOBIN (HGB A1C): HbA1c, POC (controlled diabetic range): 7.4 % — AB (ref 0.0–7.0)

## 2022-03-29 MED ORDER — ATORVASTATIN CALCIUM 40 MG PO TABS: 40.0000 mg | ORAL_TABLET | Freq: Every day | ORAL | 3 refills | Status: DC

## 2022-03-29 NOTE — Progress Notes (Signed)
Patient is here for complete physical examination ?Patient said she has no concerns for provider today. ?

## 2022-03-29 NOTE — Progress Notes (Addendum)
? ? ? ?  SUBJECTIVE:  ? ?CHIEF COMPLAINT / HPI:  ? ?Mackenzie Nichols is a 66 y.o. female presents for diabetes follow up ? ?Diabetes ?Patient's current diabetic medications include metformin XR. Tolerating well without side effects.  Patient endorses compliance with these medications. Does not check CBGs at home. Patient's last A1c was  ?Lab Results  ?Component Value Date  ? HGBA1C 7.4 (A) 03/29/2022  ? HGBA1C 7.6 (A) 08/27/2021  ? HGBA1C 7.8 (A) 11/22/2020  ?Denies abdominal pain, blurred vision, polyuria, polydipsia, hypoglycemia. Patient states they understand that diet and exercise can help with her diabetes.   ? ?Last Microalbumin, LDL, Creatinine: ?Lab Results  ?Component Value Date  ? LDLCALC 185 (H) 03/29/2022  ? CREATININE 0.85 03/29/2022  ?  ? ?HLD ?Patient is currently taking lipitor. Endorses compliance and tolerating well without side effects. Denies RUQ pain or myalgias.  ? ?Hypertension ?Patient's current antihypertensive  medications include: losartan. Compliant with medications and tolerating well without side effects. Does not check Bps at home. Denies any SOB, CP, vision changes, LE edema, medication SEs, or symptoms of hypotension.  ? ?Most recent creatinine trend:  ?Lab Results  ?Component Value Date  ? CREATININE 0.85 03/29/2022  ? CREATININE 0.94 12/13/2020  ? CREATININE 0.91 06/25/2019  ?  ? ?Patient has had a BMP in the past 1 year. ? ?[place] Office Visit from 03/29/2022 in [place]  ?PHQ-9 Total Score 0  ? ?  ?  ?PERTINENT  PMH / PSH: HTN, DM, HLD  ? ?OBJECTIVE:  ? ?BP 137/71   Pulse 76   Temp 98.5 ?F (36.9 ?C) (Oral)   Resp 16   Ht '5\' 2"'$  (1.575 m)   Wt 229 lb 6.4 oz (104.1 kg)   LMP 06/15/2013   SpO2 100%   BMI 41.96 kg/m?   ? ?General: Alert, no acute distress ?Cardio: well perfused  ?Pulm: normal work of breathing ?Neuro: Cranial nerves grossly intact  ? ?ASSESSMENT/PLAN:  ? ?Diabetes mellitus, type 2 ([place]) ?A1c 7.4 at goal. No changes made today.   ? ?Essential hypertension, benign ?BP at goal. Continue Losartan. Obtained BMP today.  ? ?Hypercholesterolemia ?LDL 185, total cholesterol 277. ASCVD 29%. LDL goal <70. Increased atorvastatin to '80mg'$  and recommended diet and exercise changes.  ?  ? ?Lattie Haw, MD PGY-3 ?[place]   ?

## 2022-03-29 NOTE — Patient Instructions (Signed)
Thank you for coming to see me today. It was a pleasure. Today we discussed your diabetes, you are doing great, well done! I recommend continuing metformin. ? ?Don't forget to schedule diabetic eye exam and mammogram ? ?Please follow-up with me as needed ? ?If you have any questions or concerns, please do not hesitate to call the office at [phone]. ? ?Best wishes,  ? ?Dr Posey Pronto   ?

## 2022-03-30 ENCOUNTER — Encounter: Payer: Self-pay | Admitting: Family Medicine

## 2022-03-30 LAB — BASIC METABOLIC PANEL
BUN/Creatinine Ratio: 11 — ABNORMAL LOW (ref 12–28)
BUN: 9 mg/dL (ref 8–27)
CO2: 26 mmol/L (ref 20–29)
Calcium: 10.2 mg/dL (ref 8.7–10.3)
Chloride: 101 mmol/L (ref 96–106)
Creatinine, Ser: 0.85 mg/dL (ref 0.57–1.00)
Glucose: 126 mg/dL — ABNORMAL HIGH (ref 70–99)
Potassium: 4.9 mmol/L (ref 3.5–5.2)
Sodium: 142 mmol/L (ref 134–144)
eGFR: 76 mL/min/{1.73_m2} (ref >59)

## 2022-03-30 LAB — LIPID PANEL
Chol/HDL Ratio: 4.7 ratio — ABNORMAL HIGH (ref 0.0–4.4)
Cholesterol, Total: 277 mg/dL — ABNORMAL HIGH (ref 100–199)
HDL: 59 mg/dL (ref >39)
LDL Chol Calc (NIH): 185 mg/dL — ABNORMAL HIGH (ref 0–99)
Triglycerides: 177 mg/dL — ABNORMAL HIGH (ref 0–149)
VLDL Cholesterol Cal: 33 mg/dL (ref 5–40)

## 2022-04-03 ENCOUNTER — Encounter: Payer: Self-pay | Admitting: Family Medicine

## 2022-04-03 ENCOUNTER — Other Ambulatory Visit: Payer: Self-pay | Admitting: Family Medicine

## 2022-04-03 DIAGNOSIS — E7800 Pure hypercholesterolemia, unspecified: Secondary | ICD-10-CM

## 2022-04-03 MED ORDER — ATORVASTATIN CALCIUM 80 MG PO TABS: 80.0000 mg | ORAL_TABLET | Freq: Every day | ORAL | 3 refills | Status: DC

## 2022-04-04 NOTE — Assessment & Plan Note (Addendum)
LDL 185, total cholesterol 277. ASCVD 29%. LDL goal <70. Increased atorvastatin to '80mg'$  and recommended diet and exercise changes.  ?

## 2022-04-04 NOTE — Assessment & Plan Note (Signed)
BP at goal. Continue Losartan. Obtained BMP today.  ?

## 2022-04-04 NOTE — Assessment & Plan Note (Addendum)
A1c 7.4 at goal. No changes made today.  ?

## 2022-04-19 ENCOUNTER — Other Ambulatory Visit (HOSPITAL_COMMUNITY): Payer: Self-pay

## 2022-05-17 ENCOUNTER — Inpatient Hospital Stay: Admission: RE | Admit: 2022-05-17 | Payer: Self-pay | Source: Ambulatory Visit

## 2022-05-21 ENCOUNTER — Ambulatory Visit
Admission: RE | Admit: 2022-05-21 | Discharge: 2022-05-21 | Disposition: A | Discharge status: Home / Self Care | Payer: Commercial Managed Care - HMO | Source: Ambulatory Visit | Attending: Family Medicine | Admitting: Family Medicine

## 2022-05-21 ENCOUNTER — Encounter: Payer: Self-pay | Admitting: *Deleted

## 2022-05-21 DIAGNOSIS — Z1231 Encounter for screening mammogram for malignant neoplasm of breast: Secondary | ICD-10-CM

## 2022-07-16 ENCOUNTER — Other Ambulatory Visit: Payer: Self-pay | Admitting: Family Medicine

## 2022-07-16 DIAGNOSIS — E119 Type 2 diabetes mellitus without complications: Secondary | ICD-10-CM

## 2023-01-23 ENCOUNTER — Other Ambulatory Visit: Payer: Self-pay

## 2023-01-24 ENCOUNTER — Ambulatory Visit: Payer: Commercial Managed Care - HMO | Admitting: Family Medicine

## 2023-02-14 DIAGNOSIS — I10 Essential (primary) hypertension: Secondary | ICD-10-CM | POA: Diagnosis not present

## 2023-02-14 DIAGNOSIS — E1165 Type 2 diabetes mellitus with hyperglycemia: Secondary | ICD-10-CM | POA: Diagnosis not present

## 2023-02-17 ENCOUNTER — Other Ambulatory Visit: Payer: Self-pay

## 2023-02-18 ENCOUNTER — Other Ambulatory Visit: Payer: Self-pay | Admitting: Family Medicine

## 2023-02-18 MED ORDER — LOSARTAN POTASSIUM 25 MG PO TABS: 25.0000 mg | ORAL_TABLET | Freq: Every day | ORAL | 1 refills | Status: DC

## 2023-02-20 DIAGNOSIS — H35033 Hypertensive retinopathy, bilateral: Secondary | ICD-10-CM | POA: Diagnosis not present

## 2023-02-20 DIAGNOSIS — H2513 Age-related nuclear cataract, bilateral: Secondary | ICD-10-CM | POA: Diagnosis not present

## 2023-02-20 DIAGNOSIS — E119 Type 2 diabetes mellitus without complications: Secondary | ICD-10-CM | POA: Diagnosis not present

## 2023-02-20 DIAGNOSIS — H3562 Retinal hemorrhage, left eye: Secondary | ICD-10-CM | POA: Diagnosis not present

## 2023-03-11 LAB — HM DIABETES EYE EXAM

## 2023-03-16 DIAGNOSIS — E1165 Type 2 diabetes mellitus with hyperglycemia: Secondary | ICD-10-CM | POA: Diagnosis not present

## 2023-03-16 DIAGNOSIS — I10 Essential (primary) hypertension: Secondary | ICD-10-CM | POA: Diagnosis not present

## 2023-03-17 DIAGNOSIS — I10 Essential (primary) hypertension: Secondary | ICD-10-CM | POA: Diagnosis not present

## 2023-03-17 DIAGNOSIS — E1165 Type 2 diabetes mellitus with hyperglycemia: Secondary | ICD-10-CM | POA: Diagnosis not present

## 2023-04-14 ENCOUNTER — Encounter: Payer: Commercial Managed Care - HMO | Admitting: Family Medicine

## 2023-04-15 ENCOUNTER — Encounter: Payer: Commercial Managed Care - HMO | Admitting: Family Medicine

## 2023-04-15 ENCOUNTER — Telehealth: Payer: Self-pay

## 2023-04-15 NOTE — Telephone Encounter (Signed)
Patient calls nurse line requesting I reach out to her insurance.   She reports her insurance needs a chronic condition diagnoses in order to remain enrolled in current insurance program.  I called [employer] and spoke with a representative. Patient has Type 2 DM. Information given.

## 2023-04-19 DIAGNOSIS — Z1231 Encounter for screening mammogram for malignant neoplasm of breast: Secondary | ICD-10-CM

## 2023-04-19 DIAGNOSIS — Z23 Encounter for immunization: Secondary | ICD-10-CM | POA: Insufficient documentation

## 2023-04-19 DIAGNOSIS — Z124 Encounter for screening for malignant neoplasm of cervix: Secondary | ICD-10-CM

## 2023-04-19 DIAGNOSIS — Z9189 Other specified personal risk factors, not elsewhere classified: Secondary | ICD-10-CM | POA: Insufficient documentation

## 2023-04-19 HISTORY — DX: Encounter for screening for malignant neoplasm of cervix: Z12.4

## 2023-04-19 HISTORY — DX: Encounter for screening mammogram for malignant neoplasm of breast: Z12.31

## 2023-04-19 NOTE — Patient Instructions (Signed)
It was wonderful to see you today. Thank you for allowing me to be a part of your care. Below is a short summary of what we discussed at your visit today:  Annual physical Blood work and PAP smear today. If the results are normal, I will send you a letter or MyChart message. If the results are abnormal, I will give you a call.    Vaccines Today you received the Prevnar 20 pneumococcal pneumonia vaccine and the Tdap vaccine.  You may experience some residual soreness at the injection site.  Gentle stretches and regular use of that arm will help speed up your recovery.  As the vaccines are giving your immune system a "practice run" against specific infections, you may feel a little under the weather for the next several days.  We recommend rest as needed and hydrating.  ***Today you declined the Prevnar 20 pneumococcal pneumonia vaccine and the Tdap vaccine. If you change your mind at any time, you may simply call the clinic and make a vaccine only appointment with a nurse at your convenience.  We recommend completing the two-shot COVID vaccine once and also getting your flu shot every year.  While it does not fully prevent you from getting the flu or COVID, it does reduce symptom severity and keep you out of the hospital.  Shingles Vaccine I have written a prescription for the shingles vaccine.  Please take this to your pharmacy to have this administered. Your insurance prefers you get this specific vaccine at the pharmacy instead of in clinic.   COVID booster You are eligible for this booster to protect you from the most recent COVID strain.  Please obtain this at your local pharmacy.  Diabetes Today your A1c was***. We collected a urine sample to see if there is protein in your urine.  This would indicate diabetes damage to the kidneys. Number to see your ophthalmologist every year for routine diabetic eye exam.  Blood pressure Today your blood pressure was***. Keep taking the losartan 100 mg  tablets as prescribed. We collected some blood work today to check in on your kidney function. Results as above.   If you have any questions or concerns, please do not hesitate to contact us via phone or MyChart message.   Fayette Pho, MD

## 2023-04-21 ENCOUNTER — Other Ambulatory Visit: Payer: Self-pay

## 2023-04-21 ENCOUNTER — Other Ambulatory Visit (HOSPITAL_COMMUNITY)
Admission: RE | Admit: 2023-04-21 | Discharge: 2023-04-21 | Disposition: A | Discharge status: Home / Self Care | Payer: Commercial Managed Care - HMO | Source: Ambulatory Visit | Attending: Family Medicine | Admitting: Family Medicine

## 2023-04-21 ENCOUNTER — Encounter: Payer: Self-pay | Admitting: Family Medicine

## 2023-04-21 ENCOUNTER — Ambulatory Visit (INDEPENDENT_AMBULATORY_CARE_PROVIDER_SITE_OTHER): Payer: Medicare HMO | Admitting: Family Medicine

## 2023-04-21 VITALS — BP 133/70 | HR 79 | Ht 62.0 in | Wt 225.6 lb

## 2023-04-21 DIAGNOSIS — Z1231 Encounter for screening mammogram for malignant neoplasm of breast: Secondary | ICD-10-CM | POA: Diagnosis not present

## 2023-04-21 DIAGNOSIS — Z0000 Encounter for general adult medical examination without abnormal findings: Secondary | ICD-10-CM

## 2023-04-21 DIAGNOSIS — Z124 Encounter for screening for malignant neoplasm of cervix: Secondary | ICD-10-CM | POA: Insufficient documentation

## 2023-04-21 DIAGNOSIS — Z9189 Other specified personal risk factors, not elsewhere classified: Secondary | ICD-10-CM

## 2023-04-21 DIAGNOSIS — I10 Essential (primary) hypertension: Secondary | ICD-10-CM

## 2023-04-21 DIAGNOSIS — Z23 Encounter for immunization: Secondary | ICD-10-CM

## 2023-04-21 DIAGNOSIS — E119 Type 2 diabetes mellitus without complications: Secondary | ICD-10-CM | POA: Diagnosis not present

## 2023-04-21 DIAGNOSIS — E7800 Pure hypercholesterolemia, unspecified: Secondary | ICD-10-CM | POA: Diagnosis not present

## 2023-04-21 LAB — POCT GLYCOSYLATED HEMOGLOBIN (HGB A1C): HbA1c, POC (controlled diabetic range): 7.6 % — AB (ref 0.0–7.0)

## 2023-04-21 MED ORDER — ZOSTER VAC RECOMB ADJUVANTED 50 MCG/0.5ML IM SUSR
0.5000 mL | Freq: Once | INTRAMUSCULAR | 0 refills | Status: AC
Start: 2023-04-21 — End: 2023-04-21

## 2023-04-21 NOTE — Assessment & Plan Note (Signed)
Pap collected today.  Will follow-up results.  Physical exam unremarkable.

## 2023-04-21 NOTE — Assessment & Plan Note (Addendum)
None previously completed on file.  Ordered DEXA today, see AVS for more.

## 2023-04-21 NOTE — Assessment & Plan Note (Signed)
Provided printed prescription for Shingrix vaccine. 

## 2023-04-21 NOTE — Assessment & Plan Note (Signed)
BP at goal today.  Tolerating losartan well without adverse side effects.  No changes at this time.  Will check creatinine and potassium today.

## 2023-04-21 NOTE — Assessment & Plan Note (Signed)
Next due in June, ordered today.  See AVS for more.

## 2023-04-21 NOTE — Assessment & Plan Note (Signed)
A1c today stable, at goal.  Tolerating metformin well, no adverse side effects.  Collected urine for urine microalbumin study today.  If moderate or severe microalbuminuria, will consider combination of metformin and SGLT2 for renal protection.

## 2023-04-21 NOTE — Progress Notes (Signed)
    SUBJECTIVE:   CHIEF COMPLAINT / HPI:   Annual physical  MAYLI HANA is a pleasant 67 y.o. woman who presents for her annual physical exam.  Medications and allergies updated.  Vaccines due: Prevnar 20, Tdap, COVID booster Screenings due: DEXA, mammogram Pap: Due today Mammogram: Due in June, last 05/21/2022 BI-RADS 1 Colonoscopy: Up-to-date, last completed 2016 DEXA: Due, none prior on file  PERTINENT  PMH / PSH:  Patient Active Problem List   Diagnosis Date Noted   At risk for loss of bone density 04/21/2023   Cervical cancer screening 04/19/2023   Breast cancer screening by mammogram 04/19/2023   Healthcare maintenance 08/27/2021   Dry eye 06/11/2021   Acute bilateral low back pain without sciatica 01/19/2021   Epigastric pain 06/25/2019   Conjunctivitis 03/02/2019   Vaginal neoplasm benign 09/21/2015   Bloody stools 08/09/2014   Essential hypertension, benign 07/15/2014   Diabetes mellitus, type 2 (HCC) 07/15/2014   Fibroid uterus 09/01/2013   Hypercholesterolemia 06/16/2013   Morbid obesity with BMI of 40.0-44.9, adult (HCC) 09/17/2012    OBJECTIVE:   BP 133/70   Pulse 79   Ht 5\' 2"  (1.575 m)   Wt 225 lb 9.6 oz (102.3 kg)   LMP 06/15/2013   SpO2 99%   BMI 41.26 kg/m    Physical Exam General: Awake, alert, oriented, no acute distress Neck: Soft, supple, thyroid unremarkable to palpation Cardiac: Regular rate and rhythm, no murmurs Respiratory: CTAB Vulva: Normal appearing vulva without rashes, lesions, or deformities Vagina: Pale pink rugated vaginal tissue without obvious lesions, physiologic discharge of whitish color, cervix without lesion or overt tenderness with swab  Sensitive exam performed with chaperone in the room:  Penni Bombard, CMA  ASSESSMENT/PLAN:   Pneumococcal vaccination indicated Politely declined Prevnar 20 vaccination today.  Need for Tdap vaccination Politely declined Tdap vaccination today.  Need for shingles  vaccine Provided printed prescription for Shingrix vaccine.  At risk for loss of bone density None previously completed on file.  Ordered DEXA today, see AVS for more.   Breast cancer screening by mammogram Next due in June, ordered today.  See AVS for more.  Cervical cancer screening Pap collected today.  Will follow-up results.  Physical exam unremarkable.  Diabetes mellitus, type 2 (HCC) A1c today stable, at goal.  Tolerating metformin well, no adverse side effects.  Collected urine for urine microalbumin study today.  If moderate or severe microalbuminuria, will consider combination of metformin and SGLT2 for renal protection.  Essential hypertension, benign BP at goal today.  Tolerating losartan well without adverse side effects.  No changes at this time.  Will check creatinine and potassium today.  Hypercholesterolemia Lipid panel today.  Tolerating atorvastatin well without adverse side effects.     Fayette Pho, MD [facility]

## 2023-04-21 NOTE — Assessment & Plan Note (Signed)
Lipid panel today.  Tolerating atorvastatin well without adverse side effects.

## 2023-04-21 NOTE — Assessment & Plan Note (Signed)
Politely declined Prevnar 20 vaccination today.

## 2023-04-21 NOTE — Assessment & Plan Note (Signed)
Politely declined Tdap vaccination today.

## 2023-04-22 ENCOUNTER — Telehealth: Payer: Self-pay | Admitting: Family Medicine

## 2023-04-22 LAB — LIPID PANEL
Chol/HDL Ratio: 3.4 ratio (ref 0.0–4.4)
Cholesterol, Total: 203 mg/dL — ABNORMAL HIGH (ref 100–199)
HDL: 59 mg/dL (ref >39)
LDL Chol Calc (NIH): 120 mg/dL — ABNORMAL HIGH (ref 0–99)
Triglycerides: 134 mg/dL (ref 0–149)
VLDL Cholesterol Cal: 24 mg/dL (ref 5–40)

## 2023-04-22 LAB — CBC
Hematocrit: 38.1 % (ref 34.0–46.6)
Hemoglobin: 12.9 g/dL (ref 11.1–15.9)
MCH: 29.5 pg (ref 26.6–33.0)
MCHC: 33.9 g/dL (ref 31.5–35.7)
MCV: 87 fL (ref 79–97)
Platelets: 374 10*3/uL (ref 150–450)
RBC: 4.38 x10E6/uL (ref 3.77–5.28)
RDW: 12.5 % (ref 11.7–15.4)
WBC: 6.4 10*3/uL (ref 3.4–10.8)

## 2023-04-22 LAB — BASIC METABOLIC PANEL
BUN/Creatinine Ratio: 13 (ref 12–28)
BUN: 12 mg/dL (ref 8–27)
CO2: 23 mmol/L (ref 20–29)
Calcium: 9.5 mg/dL (ref 8.7–10.3)
Chloride: 100 mmol/L (ref 96–106)
Creatinine, Ser: 0.89 mg/dL (ref 0.57–1.00)
Glucose: 172 mg/dL — ABNORMAL HIGH (ref 70–99)
Potassium: 4 mmol/L (ref 3.5–5.2)
Sodium: 138 mmol/L (ref 134–144)
eGFR: 72 mL/min/{1.73_m2} (ref >59)

## 2023-04-22 LAB — MICROALBUMIN / CREATININE URINE RATIO
Creatinine, Urine: 93.9 mg/dL
Microalb/Creat Ratio: 17 mg/g creat (ref 0–29)
Microalbumin, Urine: 15.5 ug/mL

## 2023-04-22 NOTE — Telephone Encounter (Signed)
Called patient to discuss labs. No answer, left VM.   A1c stable, at goal. BMP and CBC wnl.  Urine microalbumin low.  Lipids improved, although still above goal of LDL <70.  - already on atorvastatin 80 mg daily - could add on zetia  Fayette Pho, MD     The 10-year ASCVD risk score (Arnett DK, et al., 2019) is: 22.3%   Values used to calculate the score:     Age: 67 years     Sex: Female     Is Non-Hispanic African American: Yes     Diabetic: Yes     Tobacco smoker: No     Systolic Blood Pressure: 133 mmHg     Is BP treated: Yes     HDL Cholesterol: 59 mg/dL     Total Cholesterol: 203 mg/dL

## 2023-04-23 LAB — CYTOLOGY - PAP
Adequacy: ABSENT
Comment: NEGATIVE
Diagnosis: NEGATIVE
High risk HPV: NEGATIVE

## 2023-05-28 ENCOUNTER — Telehealth: Payer: Self-pay | Admitting: Family Medicine

## 2023-05-28 ENCOUNTER — Ambulatory Visit: Payer: Commercial Managed Care - HMO

## 2023-05-28 NOTE — Telephone Encounter (Signed)
Contacted Mackenzie Nichols to schedule their annual wellness visit. Welcome to Medicare visit Due by 01/17/2024.  Thank you,  [facility] Support [facility] Direct dial  [phone]

## 2023-06-09 ENCOUNTER — Other Ambulatory Visit: Payer: Self-pay

## 2023-06-11 ENCOUNTER — Ambulatory Visit
Admission: RE | Admit: 2023-06-11 | Discharge: 2023-06-11 | Disposition: A | Discharge status: Home / Self Care | Payer: Medicare HMO | Source: Ambulatory Visit | Attending: Family Medicine | Admitting: Family Medicine

## 2023-06-11 DIAGNOSIS — Z1231 Encounter for screening mammogram for malignant neoplasm of breast: Secondary | ICD-10-CM | POA: Diagnosis not present

## 2023-06-11 NOTE — Telephone Encounter (Signed)
Received after hours page from patient. Attempted to callX2. Left generic voicemail back during office hours or call back to Emergency Line if needed.  Terisa Starr, MD  Family Medicine Teaching Service

## 2023-06-12 ENCOUNTER — Other Ambulatory Visit: Payer: Self-pay

## 2023-06-12 MED ORDER — ATORVASTATIN CALCIUM 80 MG PO TABS: 80.0000 mg | ORAL_TABLET | Freq: Every day | ORAL | 3 refills | Status: DC

## 2023-06-12 MED ORDER — LOSARTAN POTASSIUM 25 MG PO TABS: 25.0000 mg | ORAL_TABLET | Freq: Every day | ORAL | 3 refills | Status: DC

## 2023-06-15 DIAGNOSIS — I10 Essential (primary) hypertension: Secondary | ICD-10-CM | POA: Diagnosis not present

## 2023-06-15 DIAGNOSIS — E1165 Type 2 diabetes mellitus with hyperglycemia: Secondary | ICD-10-CM | POA: Diagnosis not present

## 2023-07-16 DIAGNOSIS — I10 Essential (primary) hypertension: Secondary | ICD-10-CM | POA: Diagnosis not present

## 2023-07-16 DIAGNOSIS — E1165 Type 2 diabetes mellitus with hyperglycemia: Secondary | ICD-10-CM | POA: Diagnosis not present

## 2023-09-03 DIAGNOSIS — I10 Essential (primary) hypertension: Secondary | ICD-10-CM | POA: Diagnosis not present

## 2023-09-03 DIAGNOSIS — E1165 Type 2 diabetes mellitus with hyperglycemia: Secondary | ICD-10-CM | POA: Diagnosis not present

## 2023-09-15 DIAGNOSIS — E1165 Type 2 diabetes mellitus with hyperglycemia: Secondary | ICD-10-CM | POA: Diagnosis not present

## 2023-09-15 DIAGNOSIS — I10 Essential (primary) hypertension: Secondary | ICD-10-CM | POA: Diagnosis not present

## 2023-10-09 ENCOUNTER — Ambulatory Visit (INDEPENDENT_AMBULATORY_CARE_PROVIDER_SITE_OTHER): Payer: Medicare HMO

## 2023-10-09 DIAGNOSIS — Z23 Encounter for immunization: Secondary | ICD-10-CM | POA: Diagnosis not present

## 2023-10-09 NOTE — Progress Notes (Signed)
Patient presents to nurse clinic for COVID vaccination. Upon arrival, patient reports cough, runny nose and a "raw throat" for the last 3-4 days.  Denies recent fever. Patient afebrile today at 98.8. Spoke with Dr. Manson Passey regarding patient. Advised that patient could still receive vaccination today, as she has not had fever within the last 24 hours.   Patient elects to proceed with vaccination. Administered in RD, site unremarkable, tolerated injection well. Patient observed 15 minutes post injection, no signs of adverse reaction noted.   Veronda Prude, RN

## 2023-10-16 DIAGNOSIS — E1165 Type 2 diabetes mellitus with hyperglycemia: Secondary | ICD-10-CM | POA: Diagnosis not present

## 2023-10-16 DIAGNOSIS — I10 Essential (primary) hypertension: Secondary | ICD-10-CM | POA: Diagnosis not present

## 2023-11-15 DIAGNOSIS — I10 Essential (primary) hypertension: Secondary | ICD-10-CM | POA: Diagnosis not present

## 2023-11-15 DIAGNOSIS — E1165 Type 2 diabetes mellitus with hyperglycemia: Secondary | ICD-10-CM | POA: Diagnosis not present

## 2023-12-04 DIAGNOSIS — I10 Essential (primary) hypertension: Secondary | ICD-10-CM | POA: Diagnosis not present

## 2023-12-04 DIAGNOSIS — E1165 Type 2 diabetes mellitus with hyperglycemia: Secondary | ICD-10-CM | POA: Diagnosis not present

## 2023-12-05 ENCOUNTER — Ambulatory Visit (INDEPENDENT_AMBULATORY_CARE_PROVIDER_SITE_OTHER): Payer: Medicare HMO | Admitting: Student

## 2023-12-05 VITALS — BP 145/72 | HR 98 | Ht 62.0 in | Wt 225.8 lb

## 2023-12-05 DIAGNOSIS — I10 Essential (primary) hypertension: Secondary | ICD-10-CM | POA: Diagnosis not present

## 2023-12-05 MED ORDER — LOSARTAN POTASSIUM 50 MG PO TABS
50.0000 mg | ORAL_TABLET | Freq: Every day | ORAL | 11 refills | Status: DC
Start: 2023-12-05 — End: 2024-04-22

## 2023-12-05 NOTE — Progress Notes (Signed)
    SUBJECTIVE:   CHIEF COMPLAINT / HPI:   67 year old with history of hypertension Presenting today for elevated blood pressure Patient currently on losartan 25 mg daily Endorses good compliance to medication. Reports persistently elevated home blood pressures in the 170s/90s None BP check at her pharmacy is was also corresponding to home BP readings. Denies any headache, vision changes, shortness of breath, chest pain or BLE edema.   PERTINENT  PMH / PSH: HTN, T2DM, hypercholesteremia.  OBJECTIVE:   BP (!) 145/72   Pulse 98   Ht 5\' 2"  (1.575 m)   Wt 225 lb 12.8 oz (102.4 kg)   LMP 06/15/2013   SpO2 96%   BMI 41.30 kg/m    Physical Exam General: Alert, morbidly obese, NAD Cardiovascular: RRR, No Murmurs, Normal S2/S2 Respiratory: CTAB, No wheezing or Rales Abdomen: No distension or tenderness Extremities: No edema on extremities     ASSESSMENT/PLAN:   Essential hypertension, benign BP today slightly elevated but improved from home BP readings.  Given the patient is on low-dose losartan we will increase to 50 mg daily -Increase losartan to 50 mg daily -Encourage patient to keep daily blood pressure logs -Emphasized need for weight loss to optimize blood pressure control -Discussed benefit of exercising and diet -Follow-up in 2 weeks with PCP to reassess BP and to discuss diabetes med     Jerre Simon, MD [facility]

## 2023-12-05 NOTE — Patient Instructions (Signed)
Pleasure to meet you.  Given that your blood pressure has continued to stay elevated I have increased your losartan to 50 mg daily.  Also exercising will help with reducing your blood pressure.  Keep a daily blood pressure log and we will follow-up in 2 weeks to see what your blood pressure readings are at the time.  At the next visit we will discuss your diabetes and check your A1c.

## 2023-12-05 NOTE — Assessment & Plan Note (Signed)
BP today slightly elevated but improved from home BP readings.  Given the patient is on low-dose losartan we will increase to 50 mg daily -Increase losartan to 50 mg daily -Encourage patient to keep daily blood pressure logs -Emphasized need for weight loss to optimize blood pressure control -Discussed benefit of exercising and diet -Follow-up in 2 weeks with PCP to reassess BP and to discuss diabetes med

## 2023-12-15 DIAGNOSIS — E1165 Type 2 diabetes mellitus with hyperglycemia: Secondary | ICD-10-CM | POA: Diagnosis not present

## 2023-12-15 DIAGNOSIS — I10 Essential (primary) hypertension: Secondary | ICD-10-CM | POA: Diagnosis not present

## 2023-12-16 DIAGNOSIS — E1165 Type 2 diabetes mellitus with hyperglycemia: Secondary | ICD-10-CM | POA: Diagnosis not present

## 2023-12-16 DIAGNOSIS — I10 Essential (primary) hypertension: Secondary | ICD-10-CM | POA: Diagnosis not present

## 2023-12-19 ENCOUNTER — Encounter: Payer: Self-pay | Admitting: Family Medicine

## 2023-12-19 ENCOUNTER — Ambulatory Visit: Payer: Self-pay | Admitting: Family Medicine

## 2023-12-19 NOTE — Progress Notes (Deleted)
    SUBJECTIVE:   CHIEF COMPLAINT / HPI:   Blood pressure follow-up Patient was last in clinic 2 weeks ago at which time her losartan  was increased to 50 mg due to consistently high pressures. Today her BP is ***.  She has been taking her losartan  every day, including this morning.  PERTINENT  PMH / PSH:  HLD Obesity T2DM  OBJECTIVE:   LMP 06/15/2013   General: Well-appearing, pleasant, no acute distress. HEENT: normocephalic, PERRLA, MMM, bilateral TM visualized without erythema or bulging. Cardio: Regular rate, *** rhythm, no murmurs on exam. Pulm: Clear, no wheezing, no crackles. No increased work of breathing. Abdominal: bowel sounds present, soft, non-tender, non-distended. Extremities: no peripheral edema. Moves all extremities equally. Neuro: Alert and oriented x3, speech normal in content, no facial asymmetry, strength intact and equal bilaterally in UE and LE, pupils equal and reactive to light.  Psych:  Cognition and judgment appear intact. Alert, communicative, and cooperative with normal attention span and concentration. No apparent delusions, illusions, hallucinations    ASSESSMENT/PLAN:   No problem-specific Assessment & Plan notes found for this encounter.     Lauraine Norse, DO [facility]

## 2024-01-16 DIAGNOSIS — E1165 Type 2 diabetes mellitus with hyperglycemia: Secondary | ICD-10-CM | POA: Diagnosis not present

## 2024-01-16 DIAGNOSIS — I10 Essential (primary) hypertension: Secondary | ICD-10-CM | POA: Diagnosis not present

## 2024-02-13 DIAGNOSIS — E1165 Type 2 diabetes mellitus with hyperglycemia: Secondary | ICD-10-CM | POA: Diagnosis not present

## 2024-02-13 DIAGNOSIS — I10 Essential (primary) hypertension: Secondary | ICD-10-CM | POA: Diagnosis not present

## 2024-03-11 DIAGNOSIS — E119 Type 2 diabetes mellitus without complications: Secondary | ICD-10-CM | POA: Diagnosis not present

## 2024-03-11 DIAGNOSIS — H2513 Age-related nuclear cataract, bilateral: Secondary | ICD-10-CM | POA: Diagnosis not present

## 2024-03-11 LAB — HM DIABETES EYE EXAM

## 2024-03-15 DIAGNOSIS — I10 Essential (primary) hypertension: Secondary | ICD-10-CM | POA: Diagnosis not present

## 2024-03-15 DIAGNOSIS — E1165 Type 2 diabetes mellitus with hyperglycemia: Secondary | ICD-10-CM | POA: Diagnosis not present

## 2024-04-13 DIAGNOSIS — I10 Essential (primary) hypertension: Secondary | ICD-10-CM | POA: Diagnosis not present

## 2024-04-13 DIAGNOSIS — E1165 Type 2 diabetes mellitus with hyperglycemia: Secondary | ICD-10-CM | POA: Diagnosis not present

## 2024-04-14 DIAGNOSIS — I10 Essential (primary) hypertension: Secondary | ICD-10-CM | POA: Diagnosis not present

## 2024-04-14 DIAGNOSIS — E1165 Type 2 diabetes mellitus with hyperglycemia: Secondary | ICD-10-CM | POA: Diagnosis not present
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
## 2024-04-22 ENCOUNTER — Encounter: Payer: Self-pay | Admitting: Family Medicine

## 2024-04-22 ENCOUNTER — Ambulatory Visit (INDEPENDENT_AMBULATORY_CARE_PROVIDER_SITE_OTHER): Admitting: Family Medicine

## 2024-04-22 VITALS — BP 132/82 | HR 68 | Ht 62.0 in | Wt 224.2 lb

## 2024-04-22 DIAGNOSIS — E119 Type 2 diabetes mellitus without complications: Secondary | ICD-10-CM | POA: Diagnosis not present

## 2024-04-22 DIAGNOSIS — Z780 Asymptomatic menopausal state: Secondary | ICD-10-CM | POA: Diagnosis not present

## 2024-04-22 DIAGNOSIS — Z0000 Encounter for general adult medical examination without abnormal findings: Secondary | ICD-10-CM | POA: Diagnosis not present

## 2024-04-22 DIAGNOSIS — I10 Essential (primary) hypertension: Secondary | ICD-10-CM

## 2024-04-22 DIAGNOSIS — E7800 Pure hypercholesterolemia, unspecified: Secondary | ICD-10-CM | POA: Diagnosis not present

## 2024-04-22 LAB — POCT GLYCOSYLATED HEMOGLOBIN (HGB A1C): HbA1c, POC (controlled diabetic range): 7.7 % — AB (ref 0.0–7.0)

## 2024-04-22 MED ORDER — LOSARTAN POTASSIUM 50 MG PO TABS: 50.0000 mg | ORAL_TABLET | Freq: Every day | ORAL | 3 refills | Status: AC

## 2024-04-22 MED ORDER — METFORMIN HCL ER 500 MG PO TB24: 500.0000 mg | ORAL_TABLET | Freq: Two times a day (BID) | ORAL | 3 refills | Status: AC

## 2024-04-22 MED ORDER — ATORVASTATIN CALCIUM 80 MG PO TABS
80.0000 mg | ORAL_TABLET | Freq: Every day | ORAL | 3 refills | Status: AC
Start: 2024-04-22 — End: ?

## 2024-04-22 NOTE — Patient Instructions (Addendum)
 It was great to see you today! Thank you for choosing [facility] for your primary care. Mackenzie Nichols was seen for annual physical exam.  Today we addressed: Diabetes - repeat A1c is 7.7, collected urine as well to check for protein. Will check kidney function and electrolytes through blood work. Continue Metformin  500 mg twice daily. Make dietary and lifestyle changes as able.  Cholesterol - blood collected today. Continue your Lipitor 80 mg daily. Tdap booster from pharmacy, consider Prevnar and Shingles Dexa scan to check for bone density - can get done at the [facility] Meds refilled today.  End of life care discussion and packet provided  We are checking some labs today. I will send you a MyChart message with your results, per your preference. If you do not hear about your labs in the next 2 weeks, please call the office.   You should return to our clinic Return in about 6 months (around 10/23/2024) for BP, DM, and Cholesterol. Please arrive 15 minutes before your appointment to ensure smooth check in process.  We appreciate your efforts in making this happen.  Thank you for allowing me to participate in your care, Clyda Dark, DO 04/22/2024, 9:11 AM PGY-1, [facility]

## 2024-04-22 NOTE — Assessment & Plan Note (Addendum)
 BP stable here, but elevated at home with BP cuffs. Compliant with checking her pressures at home.  - Refilled Losartan  50 mg daily - BMP and ACR today

## 2024-04-22 NOTE — Assessment & Plan Note (Addendum)
-   Last A1c 7.6 in 04/2023, today 7.7 - Home CBGs: morning 160s - Medications: Metformin  500 mg BID, refilled today - Adherence: Doing well, also trying to make dietary and hydration changes - Eye exam: Completed 3 weeks ago - Foot exam: Performed today - Microalbumin: Ordered today - Statin: Lipitor 80 mg daily - No symptoms of hypoglycemia, polyuria, polydipsia, numbness extremities, foot ulcers/trauma

## 2024-04-22 NOTE — Assessment & Plan Note (Addendum)
 Refilled Lipitor 80 mg daily. - Lipid panel today

## 2024-04-23 ENCOUNTER — Encounter: Payer: Self-pay | Admitting: Family Medicine

## 2024-04-23 LAB — BASIC METABOLIC PANEL WITH GFR
BUN/Creatinine Ratio: 10 — ABNORMAL LOW (ref 12–28)
BUN: 8 mg/dL (ref 8–27)
CO2: 24 mmol/L (ref 20–29)
Calcium: 9.6 mg/dL (ref 8.7–10.3)
Chloride: 100 mmol/L (ref 96–106)
Creatinine, Ser: 0.82 mg/dL (ref 0.57–1.00)
Glucose: 130 mg/dL — ABNORMAL HIGH (ref 70–99)
Potassium: 4.6 mmol/L (ref 3.5–5.2)
Sodium: 140 mmol/L (ref 134–144)
eGFR: 79 mL/min/{1.73_m2} (ref >59)

## 2024-04-23 LAB — LIPID PANEL
Chol/HDL Ratio: 2.9 ratio (ref 0.0–4.4)
Cholesterol, Total: 146 mg/dL (ref 100–199)
HDL: 51 mg/dL (ref >39)
LDL Chol Calc (NIH): 78 mg/dL (ref 0–99)
Triglycerides: 88 mg/dL (ref 0–149)
VLDL Cholesterol Cal: 17 mg/dL (ref 5–40)

## 2024-04-23 LAB — MICROALBUMIN / CREATININE URINE RATIO
Creatinine, Urine: 59.8 mg/dL
Microalb/Creat Ratio: <5 mg/g{creat} (ref 0–29)
Microalbumin, Urine: <3 ug/mL

## 2024-04-28 DIAGNOSIS — I10 Essential (primary) hypertension: Secondary | ICD-10-CM | POA: Diagnosis not present

## 2024-04-28 DIAGNOSIS — E1165 Type 2 diabetes mellitus with hyperglycemia: Secondary | ICD-10-CM | POA: Diagnosis not present

## 2024-05-15 DIAGNOSIS — I10 Essential (primary) hypertension: Secondary | ICD-10-CM | POA: Diagnosis not present

## 2024-05-15 DIAGNOSIS — E1165 Type 2 diabetes mellitus with hyperglycemia: Secondary | ICD-10-CM | POA: Diagnosis not present

## 2024-06-03 ENCOUNTER — Other Ambulatory Visit

## 2024-06-14 DIAGNOSIS — I10 Essential (primary) hypertension: Secondary | ICD-10-CM | POA: Diagnosis not present

## 2024-06-14 DIAGNOSIS — E1165 Type 2 diabetes mellitus with hyperglycemia: Secondary | ICD-10-CM | POA: Diagnosis not present

## 2024-06-23 ENCOUNTER — Encounter: Payer: Self-pay | Admitting: Obstetrics and Gynecology

## 2024-06-23 ENCOUNTER — Other Ambulatory Visit (HOSPITAL_COMMUNITY)
Admission: RE | Admit: 2024-06-23 | Discharge: 2024-06-23 | Disposition: A | Discharge status: Home / Self Care | Source: Ambulatory Visit | Attending: Obstetrics and Gynecology | Admitting: Obstetrics and Gynecology

## 2024-06-23 ENCOUNTER — Ambulatory Visit (INDEPENDENT_AMBULATORY_CARE_PROVIDER_SITE_OTHER): Admitting: Obstetrics and Gynecology

## 2024-06-23 VITALS — BP 138/84 | HR 89 | Ht 62.0 in | Wt 226.0 lb

## 2024-06-23 DIAGNOSIS — R58 Hemorrhage, not elsewhere classified: Secondary | ICD-10-CM | POA: Insufficient documentation

## 2024-06-23 DIAGNOSIS — K6289 Other specified diseases of anus and rectum: Secondary | ICD-10-CM

## 2024-06-23 DIAGNOSIS — Z1331 Encounter for screening for depression: Secondary | ICD-10-CM

## 2024-06-23 DIAGNOSIS — K625 Hemorrhage of anus and rectum: Secondary | ICD-10-CM | POA: Diagnosis not present

## 2024-06-23 DIAGNOSIS — N950 Postmenopausal bleeding: Secondary | ICD-10-CM

## 2024-06-23 DIAGNOSIS — Z01419 Encounter for gynecological examination (general) (routine) without abnormal findings: Secondary | ICD-10-CM

## 2024-06-23 NOTE — Progress Notes (Unsigned)
 NEW GYNECOLOGY PATIENT Patient name: Mackenzie Nichols MRN [id number]  Date of birth: 09-09-1956 Chief Complaint:   NEW PATIENT/GYN     History:  Mackenzie Nichols is a 68 y.o. [id number] being seen today for postmenopausal bleeding.    Discussed the use of AI scribe software for clinical note transcription with the patient, who gave verbal consent to proceed.  History of Present Illness Mackenzie Nichols is a 68 year old female who presents with postmenopausal spotting and rectal bleeding.  She experiences intermittent postmenopausal spotting, which occurs when wiping and sometimes appears on her pad. The spotting is bright red and occurs approximately three to four times a week. There is no associated pain, and the spotting does not correlate with sexual activity. The last episode was noted this morning.  She also reports rectal bleeding when wiping, initially attributing it to hemorrhoids. There is no blood in her stool or urine, and she has no issues with constipation. Her last colonoscopy was in 2016, with the next one due in four to five years. She is not on any blood thinners or aspirin and does not take BC powders.  Her past medical history includes fibroids, for which she had an endometrial biopsy years ago. She has had a normal Pap smear following an abnormal one many years ago. No new or worsening vaginal dryness or abnormal discharge.       Gynecologic History Patient's last menstrual period was 06/15/2013. Contraception: post menopausal status Last Pap:     Component Value Date/Time   DIAGPAP  04/21/2023 0914    - Negative for intraepithelial lesion or malignancy (NILM)   DIAGPAP  09/21/2021 1402    - Negative for intraepithelial lesion or malignancy (NILM)   HPVHIGH Negative 04/21/2023 0914   ADEQPAP  04/21/2023 0914    Satisfactory for evaluation; transformation zone component ABSENT.   ADEQPAP  09/21/2021 1402    Satisfactory for evaluation; transformation zone component  ABSENT.    High Risk HPV: Positive  Adequacy:  Satisfactory for evaluation, transformation zone component PRESENT  Diagnosis:  Atypical squamous cells of undetermined significance (ASC-US )  Last Mammogram: 05/2023 BIRADS 1 Last Colonoscopy: 2016  Obstetric History OB History  Gravida Para Term Preterm AB Living  7 6 6  1 6   SAB IAB Ectopic Multiple Live Births  1    6    # Outcome Date GA Lbr Len/2nd Weight Sex Type Anes PTL Lv  7 Term 11/06/96    M Vag-Spont   LIV  6 Term 07/24/89    F Vag-Spont   LIV  5 Term 09/22/86    M Vag-Spont   LIV  4 Term 03/13/81    M Vag-Spont   LIV  3 Term 08/28/79    F Vag-Spont   LIV  2 Term 06/04/76    F Vag-Spont   LIV  1 SAB             Past Medical History:  Diagnosis Date   Breast cancer screening by mammogram 04/19/2023   Cervical cancer screening 04/19/2023   Diabetes mellitus, type 2 (HCC)    Fibroid    Gestational diabetes    Hyperlipidemia    Hypertension    Obesity    Vaginal Pap smear, abnormal     Past Surgical History:  Procedure Laterality Date   DILATATION & CURETTAGE/HYSTEROSCOPY WITH MYOSURE [name] 07/09/2016   Procedure: DILATATION & CURETTAGE/HYSTEROSCOPY WITH MYOSURE;  Surgeon: Evalene SHAUNNA Organ, MD;  Location: [facility];  Service: Gynecology;  Laterality: [name];  request 7:30am start time  needs one hour  Needs LEEP machine available.   EXCISION VAGINAL CYST [name] 07/09/2016   Procedure: EXCISION VAGINAL MASS;  Surgeon: Evalene SHAUNNA Organ, MD;  Location: [facility];  Service: Gynecology;  Laterality: [name];   HERNIA REPAIR     WISDOM TOOTH EXTRACTION      Current Outpatient Medications on File Prior to Visit  Medication Sig Dispense Refill   atorvastatin  (LIPITOR) 80 MG tablet Take 1 tablet (80 mg total) by mouth daily. 90 tablet 3   losartan  (COZAAR ) 50 MG tablet Take 1 tablet (50 mg total) by mouth at bedtime. 90 tablet 3   metFORMIN  (GLUCOPHAGE -XR) 500 MG 24 hr tablet Take 1 tablet (500 mg total) by mouth 2 (two) times  daily with a meal. 180 tablet 3   No current facility-administered medications on file prior to visit.    Allergies  Allergen Reactions   Clindamycin /Lincomycin Hives    Social History:  reports that she has never smoked. She has never used smokeless tobacco. She reports that she does not drink alcohol and does not use drugs.  Family History  Problem Relation Age of Onset   Diabetes Mother    Hypertension Mother    Diabetes Father    Heart disease Father    Prostate cancer Father    Hypertension Father    Diabetes Sister    Hypertension Sister    Kidney disease Sister    Epilepsy Daughter    Hyperthyroidism Daughter    Epilepsy Son    Hypertension Sister    Cancer Sister    Hypertension Sister    Colon cancer Neg Hx    Colon polyps Neg Hx    Esophageal cancer Neg Hx    Gallbladder disease Neg Hx     The following portions of the patient's history were reviewed and updated as appropriate: allergies, current medications, past family history, past medical history, past social history, past surgical history and problem list.  Review of Systems Pertinent items noted in HPI and remainder of comprehensive ROS otherwise negative.  Physical Exam:  BP 138/84   Pulse 89   Ht 5' 2 (1.575 m)   Wt 226 lb (102.5 kg)   LMP 06/15/2013   BMI 41.34 kg/m  Physical Exam Vitals and nursing note reviewed. Exam conducted with a chaperone present.  Constitutional:      Appearance: Normal appearance.  Pulmonary:     Effort: Pulmonary effort is normal.  Abdominal:     Palpations: Abdomen is soft.  Genitourinary:    General: Normal vulva.     Exam position: Lithotomy position.     Vagina: Normal.     Cervix: Normal.     Rectum: Mass present. No tenderness.     Comments: 1cm friable lesion from 12 o'clock anus and 2cm mass from 7oclock to 11 oclock of anus, patent anus, nontender to palpation  Neurological:     Mental Status: She is alert.    Endometrial Biopsy Procedure   Patient identified, informed consent performed,  indication reviewed, consent signed.  Reviewed risk of perforation, pain, bleeding, insufficient sample, etc were reviewd. Time out was performed.  Urine pregnancy test negative.  Speculum placed in the vagina.  Cervix visualized.  Cleaned with Betadine x 2.  Anterior cervix grasped anteriorly with a single tooth tenaculum.  Paracervical block was not administered.  Endometrial pipelle was used to draw up 1cc of 1% lidocaine , introduced into the  cervical os and instilled into the endometrial cavity.  The pipelle was passed once without difficulty and sample obtained. Tenaculum was removed, good hemostasis noted.  Patient tolerated procedure well.  Patient was given post-procedure instructions.     Assessment and Plan:   Assessment & Plan Postmenopausal bleeding Intermittent bright red vaginal spotting. Differential includes vaginal atrophy, endometrial atrophy, polyps, endometrial hyperplasia, or cancer. Endometrial biopsy performed to rule out endometrial pathology though low likelihood of gyn etiology. Consent obtained. Pelvic ultrasound planned if biopsy inconclusive or abnormal. - Now s/p uncomplicated EMB  Rectal bleeding, anal mass/lesion Bleeding noted post-bowel movements, likely anal origin. Friable tissue at rectal outlet observed. Referral to colorectal surgeon for further evaluation and biopsy planned. Explained need for colorectal surgeon's expertise in obtaining biopsy. - Refer to colorectal surgeon for evaluation and biopsy of rectal tissue.  Follow-up: No follow-ups on file.      Carter Quarry, MD Obstetrician & Gynecologist, [facility] [employer], [facility]

## 2024-06-23 NOTE — Progress Notes (Signed)
 68 y.o. New GYN presents for Postmenopausal bleeding, Pt has been having intermittent spotting x 1 month.

## 2024-06-28 LAB — SURGICAL PATHOLOGY

## 2024-06-30 ENCOUNTER — Ambulatory Visit: Payer: Self-pay | Admitting: Obstetrics and Gynecology

## 2024-07-08 ENCOUNTER — Ambulatory Visit: Admitting: Surgery

## 2024-07-08 ENCOUNTER — Encounter: Payer: Self-pay | Admitting: Surgery

## 2024-07-08 VITALS — BP 139/85 | HR 69 | Temp 98.1°F | Resp 12 | Ht 62.0 in | Wt 229.0 lb

## 2024-07-08 DIAGNOSIS — K6289 Other specified diseases of anus and rectum: Secondary | ICD-10-CM | POA: Diagnosis not present

## 2024-07-08 NOTE — Progress Notes (Signed)
 [facility] History and Physical  Reason for Referral: Anal mass Referring Physician: Dr. Jeralyn  Chief Complaint   New Patient (Initial Visit)     Mackenzie Nichols is a 68 y.o. female.  HPI: Patient presents for evaluation of an anal mass.  For the last few months she has noted a little bit of blood with wiping.  She went to her oncologist, who noted an anal mass and referred her to surgery for evaluation.  She denies any pain associated with this area.  She has had a history of pain and bleeding from hemorrhoids, but has not had any problems with those in a while.  Her last colonoscopy was 9 years ago, and there were no abnormalities noted at that time.  She denies any unexplained weight loss, fever, and chills.  She has no family history of colorectal cancer.  Her past medical history significant for hypertension, hyperlipidemia, and diabetes.  She denies use of blood thinning medications.  Her surgical history is significant for an umbilical hernia repair.  She denies use of tobacco products, alcohol, and illicit drugs.  Past Medical History:  Diagnosis Date   Breast cancer screening by mammogram 04/19/2023   Cervical cancer screening 04/19/2023   Diabetes mellitus, type 2 (HCC)    Fibroid    Gestational diabetes    Hyperlipidemia    Hypertension    Obesity    Vaginal Pap smear, abnormal     Past Surgical History:  Procedure Laterality Date   DILATATION & CURETTAGE/HYSTEROSCOPY WITH MYOSURE [name] 07/09/2016   Procedure: DILATATION & CURETTAGE/HYSTEROSCOPY WITH MYOSURE;  Surgeon: Evalene SHAUNNA Organ, MD;  Location: [facility];  Service: Gynecology;  Laterality: [name];  request 7:30am start time  needs one hour  Needs LEEP machine available.   EXCISION VAGINAL CYST [name] 07/09/2016   Procedure: EXCISION VAGINAL MASS;  Surgeon: Evalene SHAUNNA Organ, MD;  Location: [facility];  Service: Gynecology;  Laterality: [name];   HERNIA REPAIR     WISDOM TOOTH EXTRACTION      Family  History  Problem Relation Age of Onset   Diabetes Mother    Hypertension Mother    Diabetes Father    Heart disease Father    Prostate cancer Father    Hypertension Father    Diabetes Sister    Hypertension Sister    Kidney disease Sister    Epilepsy Daughter    Hyperthyroidism Daughter    Epilepsy Son    Hypertension Sister    Cancer Sister    Hypertension Sister    Colon cancer Neg Hx    Colon polyps Neg Hx    Esophageal cancer Neg Hx    Gallbladder disease Neg Hx     Social History   Tobacco Use   Smoking status: Never   Smokeless tobacco: Never  Vaping Use   Vaping status: Never Used  Substance Use Topics   Alcohol use: No    Alcohol/week: 0.0 standard drinks of alcohol   Drug use: No    Medications: I have reviewed the patient's current medications. Allergies as of 07/08/2024       Reactions   Clindamycin /lincomycin Hives        Medication List        Accurate as of July 08, 2024 11:00 AM. If you have any questions, ask your nurse or doctor.          atorvastatin  80 MG tablet Commonly known as: LIPITOR Take 1 tablet (80 mg total) by mouth  daily.   losartan  50 MG tablet Commonly known as: COZAAR  Take 1 tablet (50 mg total) by mouth at bedtime.   metFORMIN  500 MG 24 hr tablet Commonly known as: GLUCOPHAGE -XR Take 1 tablet (500 mg total) by mouth 2 (two) times daily with a meal.         ROS:  Constitutional: negative for chills, fatigue, and fevers Eyes: negative for visual disturbance and pain Ears, nose, mouth, throat, and face: negative for ear drainage, sore throat, and sinus problems Respiratory: negative for cough, wheezing, and shortness of breath Cardiovascular: negative for chest pain and palpitations Gastrointestinal: negative for abdominal pain, nausea, reflux symptoms, and vomiting Genitourinary:negative for dysuria and frequency Integument/breast: negative for dryness and rash Hematologic/lymphatic: negative for bleeding  and lymphadenopathy Musculoskeletal:negative for back pain and neck pain Neurological: negative for dizziness and tremors Endocrine: negative for temperature intolerance  Blood pressure 139/85, pulse 69, temperature 98.1 F (36.7 C), temperature source Oral, resp. rate 12, height 5' 2 (1.575 m), weight 229 lb (103.9 kg), last menstrual period 06/15/2013, SpO2 95%. Physical Exam Vitals reviewed.  Constitutional:      Appearance: Normal appearance.  HENT:     Head: Normocephalic and atraumatic.  Eyes:     Extraocular Movements: Extraocular movements intact.     Pupils: Pupils are equal, round, and reactive to light.  Cardiovascular:     Rate and Rhythm: Normal rate and regular rhythm.  Pulmonary:     Effort: Pulmonary effort is normal.     Breath sounds: Normal breath sounds.  Abdominal:     General: There is no distension.     Palpations: Abdomen is soft.     Tenderness: There is no abdominal tenderness.  Genitourinary:    Comments: With the patient in a left lateral decubitus position, she had an external hemorrhoid noted at the 10 o'clock position.  Area of swelling with a 2 cm fungating/friable mass at the 3 o'clock position; small palpable internal hemorrhoids without significant mass noted on digital rectal exam Musculoskeletal:        General: Normal range of motion.     Cervical back: Normal range of motion.  Skin:    General: Skin is warm and dry.  Neurological:     General: No focal deficit present.     Mental Status: She is alert and oriented to person, place, and time.  Psychiatric:        Mood and Affect: Mood normal.        Behavior: Behavior normal.     Results: No results found for this or any previous visit (from the past 48 hours).  No results found.   Assessment & Plan:  Mackenzie Nichols is a 68 y.o. female who presents for evaluation of an anal mass.  -We discussed her findings on physical exam with a fungating/friable area which is likely the  source of her bleeding.  We discussed need for biopsy of this area. -The risk and benefits of anal exam under anesthesia with biopsy of anal mass were discussed including but not limited to bleeding, infection, injury to surrounding structures, and need for additional procedures.  After careful consideration, Mackenzie Nichols has decided to proceed with surgery.  We further discussed that additional treatments and surgery may be necessary pending the results of her biopsy. -Patient tentatively scheduled for surgery on 8/11 -Advised that she should call the office if she has severe bleeding or pain related to this mass/area  All questions were answered to  the satisfaction of the patient.  Note: Portions of this report may have been transcribed using voice recognition software. Every effort has been made to ensure accuracy; however, inadvertent computerized transcription errors may still be present.   Dorothyann Brittle, DO [facility] [address] [place], [place] [id number] [phone] (office)

## 2024-07-12 ENCOUNTER — Telehealth: Payer: Self-pay | Admitting: *Deleted

## 2024-07-12 NOTE — H&P (View-Only) (Signed)
 [facility] History and Physical  Reason for Referral: Anal mass Referring Physician: Dr. Jeralyn  Chief Complaint   New Patient (Initial Visit)     Mackenzie Nichols is a 68 y.o. female.  HPI: Patient presents for evaluation of an anal mass.  For the last few months she has noted a little bit of blood with wiping.  She went to her oncologist, who noted an anal mass and referred her to surgery for evaluation.  She denies any pain associated with this area.  She has had a history of pain and bleeding from hemorrhoids, but has not had any problems with those in a while.  Her last colonoscopy was 9 years ago, and there were no abnormalities noted at that time.  She denies any unexplained weight loss, fever, and chills.  She has no family history of colorectal cancer.  Her past medical history significant for hypertension, hyperlipidemia, and diabetes.  She denies use of blood thinning medications.  Her surgical history is significant for an umbilical hernia repair.  She denies use of tobacco products, alcohol, and illicit drugs.  Past Medical History:  Diagnosis Date   Breast cancer screening by mammogram 04/19/2023   Cervical cancer screening 04/19/2023   Diabetes mellitus, type 2 (HCC)    Fibroid    Gestational diabetes    Hyperlipidemia    Hypertension    Obesity    Vaginal Pap smear, abnormal     Past Surgical History:  Procedure Laterality Date   DILATATION & CURETTAGE/HYSTEROSCOPY WITH MYOSURE [name] 07/09/2016   Procedure: DILATATION & CURETTAGE/HYSTEROSCOPY WITH MYOSURE;  Surgeon: Evalene SHAUNNA Organ, MD;  Location: [facility];  Service: Gynecology;  Laterality: [name];  request 7:30am start time  needs one hour  Needs LEEP machine available.   EXCISION VAGINAL CYST [name] 07/09/2016   Procedure: EXCISION VAGINAL MASS;  Surgeon: Evalene SHAUNNA Organ, MD;  Location: [facility];  Service: Gynecology;  Laterality: [name];   HERNIA REPAIR     WISDOM TOOTH EXTRACTION      Family  History  Problem Relation Age of Onset   Diabetes Mother    Hypertension Mother    Diabetes Father    Heart disease Father    Prostate cancer Father    Hypertension Father    Diabetes Sister    Hypertension Sister    Kidney disease Sister    Epilepsy Daughter    Hyperthyroidism Daughter    Epilepsy Son    Hypertension Sister    Cancer Sister    Hypertension Sister    Colon cancer Neg Hx    Colon polyps Neg Hx    Esophageal cancer Neg Hx    Gallbladder disease Neg Hx     Social History   Tobacco Use   Smoking status: Never   Smokeless tobacco: Never  Vaping Use   Vaping status: Never Used  Substance Use Topics   Alcohol use: No    Alcohol/week: 0.0 standard drinks of alcohol   Drug use: No    Medications: I have reviewed the patient's current medications. Allergies as of 07/08/2024       Reactions   Clindamycin /lincomycin Hives        Medication List        Accurate as of July 08, 2024 11:00 AM. If you have any questions, ask your nurse or doctor.          atorvastatin  80 MG tablet Commonly known as: LIPITOR Take 1 tablet (80 mg total) by mouth  daily.   losartan  50 MG tablet Commonly known as: COZAAR  Take 1 tablet (50 mg total) by mouth at bedtime.   metFORMIN  500 MG 24 hr tablet Commonly known as: GLUCOPHAGE -XR Take 1 tablet (500 mg total) by mouth 2 (two) times daily with a meal.         ROS:  Constitutional: negative for chills, fatigue, and fevers Eyes: negative for visual disturbance and pain Ears, nose, mouth, throat, and face: negative for ear drainage, sore throat, and sinus problems Respiratory: negative for cough, wheezing, and shortness of breath Cardiovascular: negative for chest pain and palpitations Gastrointestinal: negative for abdominal pain, nausea, reflux symptoms, and vomiting Genitourinary:negative for dysuria and frequency Integument/breast: negative for dryness and rash Hematologic/lymphatic: negative for bleeding  and lymphadenopathy Musculoskeletal:negative for back pain and neck pain Neurological: negative for dizziness and tremors Endocrine: negative for temperature intolerance  Blood pressure 139/85, pulse 69, temperature 98.1 F (36.7 C), temperature source Oral, resp. rate 12, height 5' 2 (1.575 m), weight 229 lb (103.9 kg), last menstrual period 06/15/2013, SpO2 95%. Physical Exam Vitals reviewed.  Constitutional:      Appearance: Normal appearance.  HENT:     Head: Normocephalic and atraumatic.  Eyes:     Extraocular Movements: Extraocular movements intact.     Pupils: Pupils are equal, round, and reactive to light.  Cardiovascular:     Rate and Rhythm: Normal rate and regular rhythm.  Pulmonary:     Effort: Pulmonary effort is normal.     Breath sounds: Normal breath sounds.  Abdominal:     General: There is no distension.     Palpations: Abdomen is soft.     Tenderness: There is no abdominal tenderness.  Genitourinary:    Comments: With the patient in a left lateral decubitus position, she had an external hemorrhoid noted at the 10 o'clock position.  Area of swelling with a 2 cm fungating/friable mass at the 3 o'clock position; small palpable internal hemorrhoids without significant mass noted on digital rectal exam Musculoskeletal:        General: Normal range of motion.     Cervical back: Normal range of motion.  Skin:    General: Skin is warm and dry.  Neurological:     General: No focal deficit present.     Mental Status: She is alert and oriented to person, place, and time.  Psychiatric:        Mood and Affect: Mood normal.        Behavior: Behavior normal.     Results: No results found for this or any previous visit (from the past 48 hours).  No results found.   Assessment & Plan:  Mackenzie Nichols is a 68 y.o. female who presents for evaluation of an anal mass.  -We discussed her findings on physical exam with a fungating/friable area which is likely the  source of her bleeding.  We discussed need for biopsy of this area. -The risk and benefits of anal exam under anesthesia with biopsy of anal mass were discussed including but not limited to bleeding, infection, injury to surrounding structures, and need for additional procedures.  After careful consideration, Mackenzie Nichols has decided to proceed with surgery.  We further discussed that additional treatments and surgery may be necessary pending the results of her biopsy. -Patient tentatively scheduled for surgery on 8/11 -Advised that she should call the office if she has severe bleeding or pain related to this mass/area  All questions were answered to  the satisfaction of the patient.  Note: Portions of this report may have been transcribed using voice recognition software. Every effort has been made to ensure accuracy; however, inadvertent computerized transcription errors may still be present.   Dorothyann Brittle, DO [facility] [address] [place], [place] [address] [phone] (office)

## 2024-07-12 NOTE — H&P (Signed)
 [facility] History and Physical  Reason for Referral: Anal mass Referring Physician: Dr. Jeralyn  Chief Complaint   New Patient (Initial Visit)     Mackenzie Nichols is a 68 y.o. female.  HPI: Patient presents for evaluation of an anal mass.  For the last few months she has noted a little bit of blood with wiping.  She went to her oncologist, who noted an anal mass and referred her to surgery for evaluation.  She denies any pain associated with this area.  She has had a history of pain and bleeding from hemorrhoids, but has not had any problems with those in a while.  Her last colonoscopy was 9 years ago, and there were no abnormalities noted at that time.  She denies any unexplained weight loss, fever, and chills.  She has no family history of colorectal cancer.  Her past medical history significant for hypertension, hyperlipidemia, and diabetes.  She denies use of blood thinning medications.  Her surgical history is significant for an umbilical hernia repair.  She denies use of tobacco products, alcohol, and illicit drugs.  Past Medical History:  Diagnosis Date   Breast cancer screening by mammogram 04/19/2023   Cervical cancer screening 04/19/2023   Diabetes mellitus, type 2 (HCC)    Fibroid    Gestational diabetes    Hyperlipidemia    Hypertension    Obesity    Vaginal Pap smear, abnormal     Past Surgical History:  Procedure Laterality Date   DILATATION & CURETTAGE/HYSTEROSCOPY WITH MYOSURE [name] 07/09/2016   Procedure: DILATATION & CURETTAGE/HYSTEROSCOPY WITH MYOSURE;  Surgeon: Evalene SHAUNNA Organ, MD;  Location: [facility];  Service: Gynecology;  Laterality: [name];  request 7:30am start time  needs one hour  Needs LEEP machine available.   EXCISION VAGINAL CYST [name] 07/09/2016   Procedure: EXCISION VAGINAL MASS;  Surgeon: Evalene SHAUNNA Organ, MD;  Location: [facility];  Service: Gynecology;  Laterality: [name];   HERNIA REPAIR     WISDOM TOOTH EXTRACTION      Family  History  Problem Relation Age of Onset   Diabetes Mother    Hypertension Mother    Diabetes Father    Heart disease Father    Prostate cancer Father    Hypertension Father    Diabetes Sister    Hypertension Sister    Kidney disease Sister    Epilepsy Daughter    Hyperthyroidism Daughter    Epilepsy Son    Hypertension Sister    Cancer Sister    Hypertension Sister    Colon cancer Neg Hx    Colon polyps Neg Hx    Esophageal cancer Neg Hx    Gallbladder disease Neg Hx     Social History   Tobacco Use   Smoking status: Never   Smokeless tobacco: Never  Vaping Use   Vaping status: Never Used  Substance Use Topics   Alcohol use: No    Alcohol/week: 0.0 standard drinks of alcohol   Drug use: No    Medications: I have reviewed the patient's current medications. Allergies as of 07/08/2024       Reactions   Clindamycin /lincomycin Hives        Medication List        Accurate as of July 08, 2024 11:00 AM. If you have any questions, ask your nurse or doctor.          atorvastatin  80 MG tablet Commonly known as: LIPITOR Take 1 tablet (80 mg total) by mouth  daily.   losartan  50 MG tablet Commonly known as: COZAAR  Take 1 tablet (50 mg total) by mouth at bedtime.   metFORMIN  500 MG 24 hr tablet Commonly known as: GLUCOPHAGE -XR Take 1 tablet (500 mg total) by mouth 2 (two) times daily with a meal.         ROS:  Constitutional: negative for chills, fatigue, and fevers Eyes: negative for visual disturbance and pain Ears, nose, mouth, throat, and face: negative for ear drainage, sore throat, and sinus problems Respiratory: negative for cough, wheezing, and shortness of breath Cardiovascular: negative for chest pain and palpitations Gastrointestinal: negative for abdominal pain, nausea, reflux symptoms, and vomiting Genitourinary:negative for dysuria and frequency Integument/breast: negative for dryness and rash Hematologic/lymphatic: negative for bleeding  and lymphadenopathy Musculoskeletal:negative for back pain and neck pain Neurological: negative for dizziness and tremors Endocrine: negative for temperature intolerance  Blood pressure 139/85, pulse 69, temperature 98.1 F (36.7 C), temperature source Oral, resp. rate 12, height 5' 2 (1.575 m), weight 229 lb (103.9 kg), last menstrual period 06/15/2013, SpO2 95%. Physical Exam Vitals reviewed.  Constitutional:      Appearance: Normal appearance.  HENT:     Head: Normocephalic and atraumatic.  Eyes:     Extraocular Movements: Extraocular movements intact.     Pupils: Pupils are equal, round, and reactive to light.  Cardiovascular:     Rate and Rhythm: Normal rate and regular rhythm.  Pulmonary:     Effort: Pulmonary effort is normal.     Breath sounds: Normal breath sounds.  Abdominal:     General: There is no distension.     Palpations: Abdomen is soft.     Tenderness: There is no abdominal tenderness.  Genitourinary:    Comments: With the patient in a left lateral decubitus position, she had an external hemorrhoid noted at the 10 o'clock position.  Area of swelling with a 2 cm fungating/friable mass at the 3 o'clock position; small palpable internal hemorrhoids without significant mass noted on digital rectal exam Musculoskeletal:        General: Normal range of motion.     Cervical back: Normal range of motion.  Skin:    General: Skin is warm and dry.  Neurological:     General: No focal deficit present.     Mental Status: She is alert and oriented to person, place, and time.  Psychiatric:        Mood and Affect: Mood normal.        Behavior: Behavior normal.     Results: No results found for this or any previous visit (from the past 48 hours).  No results found.   Assessment & Plan:  Mackenzie Nichols is a 68 y.o. female who presents for evaluation of an anal mass.  -We discussed her findings on physical exam with a fungating/friable area which is likely the  source of her bleeding.  We discussed need for biopsy of this area. -The risk and benefits of anal exam under anesthesia with biopsy of anal mass were discussed including but not limited to bleeding, infection, injury to surrounding structures, and need for additional procedures.  After careful consideration, ALEXSANDRA SHONTZ has decided to proceed with surgery.  We further discussed that additional treatments and surgery may be necessary pending the results of her biopsy. -Patient tentatively scheduled for surgery on 8/11 -Advised that she should call the office if she has severe bleeding or pain related to this mass/area  All questions were answered to  the satisfaction of the patient.  Note: Portions of this report may have been transcribed using voice recognition software. Every effort has been made to ensure accuracy; however, inadvertent computerized transcription errors may still be present.   Dorothyann Brittle, DO [facility] [address] [place], [place] [address] [phone] (office)

## 2024-07-12 NOTE — Telephone Encounter (Signed)
 Surgery re-scheduled to 07/30/2024.

## 2024-07-12 NOTE — Telephone Encounter (Signed)
 Surgery: EXAM UNDER ANESTHESIA, RECTAL (POSSIBLE BIOPSY) CPT Codes: 54009  Dx: X37.10

## 2024-07-12 NOTE — Telephone Encounter (Addendum)
 Patient left VM after clinic hours.   Requested to cancel upcoming procedure as she has court on 07/26/2024. Call placed to patient. Patient's son has court and she would like to accompany him.   Requested to reschedule per provider recommendations.

## 2024-07-15 DIAGNOSIS — E1165 Type 2 diabetes mellitus with hyperglycemia: Secondary | ICD-10-CM | POA: Diagnosis not present

## 2024-07-15 DIAGNOSIS — I10 Essential (primary) hypertension: Secondary | ICD-10-CM | POA: Diagnosis not present

## 2024-07-23 ENCOUNTER — Other Ambulatory Visit (HOSPITAL_COMMUNITY)

## 2024-07-26 ENCOUNTER — Ambulatory Visit (HOSPITAL_COMMUNITY): Admit: 2024-07-26 | Admitting: Surgery

## 2024-07-26 SURGERY — EXAM UNDER ANESTHESIA, RECTUM
Anesthesia: Choice

## 2024-07-26 NOTE — Patient Instructions (Signed)
 Mackenzie Nichols  07/26/2024     @PREFPERIOPPHARMACY @   Your procedure is scheduled on  07/30/2024.   Report to [name] at  0820  A.M.   Call this number if you have problems the morning of surgery:  [phone]  If you experience any cold or flu symptoms such as cough, fever, chills, shortness of breath, etc. between now and your scheduled surgery, please notify us  at the above number.   Remember:  Do not eat after midnight.   You may drink clear liquids until 0820 am on 07/30/2024.    Clear liquids allowed are:                    Water, Juice (No red color; non-citric and without pulp; diabetics please choose diet or no sugar options), Carbonated beverages (diabetics please choose diet or no sugar options), Clear Tea (No creamer, milk, or cream, including half & half and powdered creamer), Black Coffee Only (No creamer, milk or cream, including half & half and powdered creamer), and Clear Sports drink (No red color; diabetics please choose diet or no sugar options)    Take these medicines the morning of surgery with A SIP OF WATER                                                     None.    Do not wear jewelry, make-up or nail polish, including gel polish,  artificial nails, or any other type of covering on natural nails (fingers and  toes).  Do not wear lotions, powders, or perfumes, or deodorant.  Do not shave 48 hours prior to surgery.  Men may shave face and neck.  Do not bring valuables to the hospital.  [facility] is not responsible for any belongings or valuables.  Contacts, dentures or bridgework may not be worn into surgery.  Leave your suitcase in the car.  After surgery it may be brought to your room.  For patients admitted to the hospital, discharge time will be determined by your treatment team.  Patients discharged the day of surgery will not be allowed to drive home and must have someone with them for 24 hours.    Special instructions:  DO NOT  smoke tobacco or vape for 24 hours before your procedure.  Please read over the following fact sheets that you were given. Pain Booklet, Coughing and Deep Breathing, Surgical Site Infection Prevention, Anesthesia Post-op Instructions, and Care and Recovery After Surgery      General Anesthesia, Adult, Care After The following information offers guidance on how to care for yourself after your procedure. Your health care provider may also give you more specific instructions. If you have problems or questions, contact your health care provider. What can I expect after the procedure? After the procedure, it is common for people to: Have pain or discomfort at the IV site. Have nausea or vomiting. Have a sore throat or hoarseness. Have trouble concentrating. Feel cold or chills. Feel weak, sleepy, or tired (fatigue). Have soreness and body aches. These can affect parts of the body that were not involved in surgery. Follow these instructions at home: For the time period you were told by your health care provider:  Rest. Do not participate in activities where you could fall or  become injured. Do not drive or use machinery. Do not drink alcohol. Do not take sleeping pills or medicines that cause drowsiness. Do not make important decisions or sign legal documents. Do not take care of children on your own. General instructions Drink enough fluid to keep your urine pale yellow. If you have sleep apnea, surgery and certain medicines can increase your risk for breathing problems. Follow instructions from your health care provider about wearing your sleep device: Anytime you are sleeping, including during daytime naps. While taking prescription pain medicines, sleeping medicines, or medicines that make you drowsy. Return to your normal activities as told by your health care provider. Ask your health care provider what activities are safe for you. Take over-the-counter and prescription medicines  only as told by your health care provider. Do not use any products that contain nicotine or tobacco. These products include cigarettes, chewing tobacco, and vaping devices, such as e-cigarettes. These can delay incision healing after surgery. If you need help quitting, ask your health care provider. Contact a health care provider if: You have nausea or vomiting that does not get better with medicine. You vomit every time you eat or drink. You have pain that does not get better with medicine. You cannot urinate or have bloody urine. You develop a skin rash. You have a fever. Get help right away if: You have trouble breathing. You have chest pain. You vomit blood. These symptoms may be an emergency. Get help right away. Call 911. Do not wait to see if the symptoms will go away. Do not drive yourself to the hospital. Summary After the procedure, it is common to have a sore throat, hoarseness, nausea, vomiting, or to feel weak, sleepy, or fatigue. For the time period you were told by your health care provider, do not drive or use machinery. Get help right away if you have difficulty breathing, have chest pain, or vomit blood. These symptoms may be an emergency. This information is not intended to replace advice given to you by your health care provider. Make sure you discuss any questions you have with your health care provider. Document Revised: 03/01/2022 Document Reviewed: 03/01/2022 Elsevier Patient Education  2024 [employer].

## 2024-07-27 ENCOUNTER — Encounter (HOSPITAL_COMMUNITY): Payer: Self-pay

## 2024-07-27 ENCOUNTER — Encounter (HOSPITAL_COMMUNITY)
Admission: RE | Admit: 2024-07-27 | Discharge: 2024-07-27 | Disposition: A | Discharge status: Home / Self Care | Source: Ambulatory Visit | Attending: Surgery | Admitting: Surgery

## 2024-07-27 ENCOUNTER — Other Ambulatory Visit: Payer: Self-pay

## 2024-07-27 VITALS — Ht 62.0 in | Wt 229.0 lb

## 2024-07-27 DIAGNOSIS — Z01818 Encounter for other preprocedural examination: Secondary | ICD-10-CM | POA: Diagnosis not present

## 2024-07-27 DIAGNOSIS — I10 Essential (primary) hypertension: Secondary | ICD-10-CM | POA: Diagnosis not present

## 2024-07-27 DIAGNOSIS — Z6841 Body Mass Index (BMI) 40.0 and over, adult: Secondary | ICD-10-CM | POA: Diagnosis not present

## 2024-07-27 DIAGNOSIS — E119 Type 2 diabetes mellitus without complications: Secondary | ICD-10-CM | POA: Insufficient documentation

## 2024-07-27 LAB — BASIC METABOLIC PANEL WITH GFR
Anion gap: 11 (ref 5–15)
BUN: 12 mg/dL (ref 8–23)
CO2: 25 mmol/L (ref 22–32)
Calcium: 8.9 mg/dL (ref 8.9–10.3)
Chloride: 102 mmol/L (ref 98–111)
Creatinine, Ser: 0.76 mg/dL (ref 0.44–1.00)
GFR, Estimated: >60 mL/min (ref >60)
Glucose, Bld: 241 mg/dL — ABNORMAL HIGH (ref 70–99)
Potassium: 4 mmol/L (ref 3.5–5.1)
Sodium: 138 mmol/L (ref 135–145)

## 2024-07-28 LAB — HEMOGLOBIN A1C
Hgb A1c MFr Bld: 9.4 % — ABNORMAL HIGH (ref 4.8–5.6)
Mean Plasma Glucose: 223 mg/dL

## 2024-07-29 ENCOUNTER — Ambulatory Visit: Payer: Self-pay | Admitting: Family Medicine

## 2024-07-30 ENCOUNTER — Encounter (HOSPITAL_COMMUNITY)
Admission: RE | Disposition: A | Discharge status: Home / Self Care | Payer: Self-pay | Source: Home / Self Care | Attending: Surgery

## 2024-07-30 ENCOUNTER — Ambulatory Visit (HOSPITAL_BASED_OUTPATIENT_CLINIC_OR_DEPARTMENT_OTHER): Admitting: Anesthesiology

## 2024-07-30 ENCOUNTER — Encounter (HOSPITAL_COMMUNITY): Payer: Self-pay | Admitting: Surgery

## 2024-07-30 ENCOUNTER — Ambulatory Visit (HOSPITAL_COMMUNITY): Admitting: Anesthesiology

## 2024-07-30 ENCOUNTER — Other Ambulatory Visit: Payer: Self-pay

## 2024-07-30 ENCOUNTER — Ambulatory Visit (HOSPITAL_COMMUNITY)
Admission: RE | Admit: 2024-07-30 | Discharge: 2024-07-30 | Disposition: A | Discharge status: Home / Self Care | Attending: Surgery | Admitting: Surgery

## 2024-07-30 DIAGNOSIS — E119 Type 2 diabetes mellitus without complications: Secondary | ICD-10-CM

## 2024-07-30 DIAGNOSIS — E7800 Pure hypercholesterolemia, unspecified: Secondary | ICD-10-CM | POA: Diagnosis not present

## 2024-07-30 DIAGNOSIS — Z8249 Family history of ischemic heart disease and other diseases of the circulatory system: Secondary | ICD-10-CM | POA: Insufficient documentation

## 2024-07-30 DIAGNOSIS — I10 Essential (primary) hypertension: Secondary | ICD-10-CM

## 2024-07-30 DIAGNOSIS — E1165 Type 2 diabetes mellitus with hyperglycemia: Secondary | ICD-10-CM | POA: Diagnosis not present

## 2024-07-30 DIAGNOSIS — E66813 Obesity, class 3: Secondary | ICD-10-CM | POA: Diagnosis not present

## 2024-07-30 DIAGNOSIS — K626 Ulcer of anus and rectum: Secondary | ICD-10-CM | POA: Diagnosis not present

## 2024-07-30 DIAGNOSIS — Z6841 Body Mass Index (BMI) 40.0 and over, adult: Secondary | ICD-10-CM | POA: Diagnosis not present

## 2024-07-30 DIAGNOSIS — Z7984 Long term (current) use of oral hypoglycemic drugs: Secondary | ICD-10-CM | POA: Insufficient documentation

## 2024-07-30 DIAGNOSIS — L929 Granulomatous disorder of the skin and subcutaneous tissue, unspecified: Secondary | ICD-10-CM | POA: Diagnosis not present

## 2024-07-30 DIAGNOSIS — K622 Anal prolapse: Secondary | ICD-10-CM | POA: Diagnosis not present

## 2024-07-30 DIAGNOSIS — K6289 Other specified diseases of anus and rectum: Secondary | ICD-10-CM | POA: Diagnosis not present

## 2024-07-30 DIAGNOSIS — Z833 Family history of diabetes mellitus: Secondary | ICD-10-CM | POA: Diagnosis not present

## 2024-07-30 DIAGNOSIS — K648 Other hemorrhoids: Secondary | ICD-10-CM | POA: Insufficient documentation

## 2024-07-30 DIAGNOSIS — K649 Unspecified hemorrhoids: Secondary | ICD-10-CM | POA: Diagnosis not present

## 2024-07-30 HISTORY — PX: RECTAL EXAM UNDER ANESTHESIA: SHX6399

## 2024-07-30 HISTORY — PX: RECTAL BIOPSY: SHX2303

## 2024-07-30 LAB — GLUCOSE, CAPILLARY
Glucose-Capillary: 204 mg/dL — ABNORMAL HIGH (ref 70–99)
Glucose-Capillary: 165 mg/dL — ABNORMAL HIGH (ref 70–99)

## 2024-07-30 SURGERY — EXAM UNDER ANESTHESIA, RECTUM
Anesthesia: General | Site: Rectum

## 2024-07-30 MED ORDER — AMERICAINE 20 % RE OINT: TOPICAL_OINTMENT | RECTAL | 0 refills | Status: DC | PRN

## 2024-07-30 MED ORDER — GLYCOPYRROLATE PF 0.2 MG/ML IJ SOSY
PREFILLED_SYRINGE | INTRAMUSCULAR | Status: AC
  Filled 2024-07-30: qty 1

## 2024-07-30 MED ORDER — MIDAZOLAM HCL 5 MG/5ML IJ SOLN
INTRAMUSCULAR | Status: DC | PRN
  Administered 2024-07-30: 2 mg via INTRAVENOUS

## 2024-07-30 MED ORDER — PROPOFOL 10 MG/ML IV BOLUS
INTRAVENOUS | Status: AC
Start: 2024-07-30 — End: 2024-07-30
  Filled 2024-07-30: qty 20

## 2024-07-30 MED ORDER — LIDOCAINE VISCOUS HCL 2 % MT SOLN
OROMUCOSAL | Status: AC
  Filled 2024-07-30: qty 15

## 2024-07-30 MED ORDER — LACTATED RINGERS IV SOLN: INTRAVENOUS | Status: DC

## 2024-07-30 MED ORDER — DEXAMETHASONE SODIUM PHOSPHATE 10 MG/ML IJ SOLN
INTRAMUSCULAR | Status: AC
Start: 2024-07-30 — End: 2024-07-30
  Filled 2024-07-30: qty 1

## 2024-07-30 MED ORDER — CHLORHEXIDINE GLUCONATE 0.12 % MT SOLN
15.0000 mL | Freq: Once | OROMUCOSAL | Status: AC
  Administered 2024-07-30: 15 mL via OROMUCOSAL

## 2024-07-30 MED ORDER — DEXAMETHASONE SODIUM PHOSPHATE 10 MG/ML IJ SOLN
INTRAMUSCULAR | Status: DC | PRN
  Administered 2024-07-30: 10 mg via INTRAVENOUS

## 2024-07-30 MED ORDER — ORAL CARE MOUTH RINSE: 15.0000 mL | Freq: Once | OROMUCOSAL | Status: AC

## 2024-07-30 MED ORDER — CHLORHEXIDINE GLUCONATE CLOTH 2 % EX PADS: 6.0000 | MEDICATED_PAD | Freq: Once | CUTANEOUS | Status: DC

## 2024-07-30 MED ORDER — ACETAMINOPHEN 500 MG PO TABS: 1000.0000 mg | ORAL_TABLET | Freq: Four times a day (QID) | ORAL | 0 refills | Status: AC

## 2024-07-30 MED ORDER — BUPIVACAINE HCL (PF) 0.5 % IJ SOLN
INTRAMUSCULAR | Status: DC | PRN
  Administered 2024-07-30: 30 mL

## 2024-07-30 MED ORDER — SCOPOLAMINE 1 MG/3DAYS TD PT72
1.0000 | MEDICATED_PATCH | Freq: Once | TRANSDERMAL | Status: DC
  Administered 2024-07-30: 1.5 mg via TRANSDERMAL
  Filled 2024-07-30: qty 1

## 2024-07-30 MED ORDER — MIDAZOLAM HCL 2 MG/2ML IJ SOLN
INTRAMUSCULAR | Status: AC
  Filled 2024-07-30: qty 2

## 2024-07-30 MED ORDER — OXYCODONE HCL 5 MG PO TABS: 5.0000 mg | ORAL_TABLET | Freq: Once | ORAL | Status: DC | PRN

## 2024-07-30 MED ORDER — SODIUM CHLORIDE 0.9 % IV SOLN: 12.5000 mg | INTRAVENOUS | Status: DC | PRN

## 2024-07-30 MED ORDER — PHENYLEPHRINE 80 MCG/ML (10ML) SYRINGE FOR IV PUSH (FOR BLOOD PRESSURE SUPPORT)
PREFILLED_SYRINGE | INTRAVENOUS | Status: DC | PRN
  Administered 2024-07-30: 160 ug via INTRAVENOUS

## 2024-07-30 MED ORDER — FENTANYL CITRATE (PF) 100 MCG/2ML IJ SOLN
INTRAMUSCULAR | Status: AC
  Filled 2024-07-30: qty 2

## 2024-07-30 MED ORDER — PROPOFOL 10 MG/ML IV BOLUS
INTRAVENOUS | Status: DC | PRN
  Administered 2024-07-30: 170 mg via INTRAVENOUS

## 2024-07-30 MED ORDER — BUPIVACAINE HCL (PF) 0.5 % IJ SOLN
INTRAMUSCULAR | Status: AC
  Filled 2024-07-30: qty 30

## 2024-07-30 MED ORDER — GLYCOPYRROLATE PF 0.2 MG/ML IJ SOSY
PREFILLED_SYRINGE | INTRAMUSCULAR | Status: DC | PRN
  Administered 2024-07-30: .2 mg via INTRAVENOUS

## 2024-07-30 MED ORDER — FENTANYL CITRATE (PF) 250 MCG/5ML IJ SOLN
INTRAMUSCULAR | Status: DC | PRN
  Administered 2024-07-30 (×2): 50 ug via INTRAVENOUS

## 2024-07-30 MED ORDER — LIDOCAINE 2% (20 MG/ML) 5 ML SYRINGE
INTRAMUSCULAR | Status: AC
  Filled 2024-07-30: qty 5

## 2024-07-30 MED ORDER — SODIUM CHLORIDE 0.9 % IV SOLN
2.0000 g | INTRAVENOUS | Status: AC
  Administered 2024-07-30: 2 g via INTRAVENOUS
  Filled 2024-07-30: qty 2

## 2024-07-30 MED ORDER — ONDANSETRON HCL 4 MG/2ML IJ SOLN
INTRAMUSCULAR | Status: AC
  Filled 2024-07-30: qty 2

## 2024-07-30 MED ORDER — FENTANYL CITRATE PF 50 MCG/ML IJ SOSY: 25.0000 ug | PREFILLED_SYRINGE | INTRAMUSCULAR | Status: DC | PRN

## 2024-07-30 MED ORDER — DOCUSATE SODIUM 100 MG PO CAPS
100.0000 mg | ORAL_CAPSULE | Freq: Two times a day (BID) | ORAL | 2 refills | Status: AC
Start: 2024-07-30 — End: 2025-07-30

## 2024-07-30 MED ORDER — LIDOCAINE 2% (20 MG/ML) 5 ML SYRINGE
INTRAMUSCULAR | Status: DC | PRN
  Administered 2024-07-30: 100 mg via INTRAVENOUS

## 2024-07-30 MED ORDER — LIDOCAINE VISCOUS HCL 2 % MT SOLN
OROMUCOSAL | Status: DC | PRN
Start: 2024-07-30 — End: 2024-07-30
  Administered 2024-07-30: 1 via OROMUCOSAL

## 2024-07-30 MED ORDER — OXYCODONE HCL 5 MG/5ML PO SOLN: 5.0000 mg | Freq: Once | ORAL | Status: DC | PRN

## 2024-07-30 MED ORDER — SODIUM CHLORIDE 0.9 % IR SOLN
Status: DC | PRN
  Administered 2024-07-30: 1000 mL

## 2024-07-30 MED ORDER — ONDANSETRON HCL 4 MG/2ML IJ SOLN
INTRAMUSCULAR | Status: DC | PRN
  Administered 2024-07-30: 4 mg via INTRAVENOUS

## 2024-07-30 MED ORDER — OXYCODONE HCL 5 MG PO TABS: 5.0000 mg | ORAL_TABLET | Freq: Four times a day (QID) | ORAL | 0 refills | Status: DC | PRN

## 2024-07-30 SURGICAL SUPPLY — 25 items
COVER LIGHT HANDLE (MISCELLANEOUS) IMPLANT
DRAPE HALF SHEET 40X57 (DRAPES) ×1 IMPLANT
DRSG TELFA 3X8 NADH STRL (GAUZE/BANDAGES/DRESSINGS) IMPLANT
ELECTRODE REM PT RTRN 9FT ADLT (ELECTROSURGICAL) ×1 IMPLANT
GAUZE 4X4 16PLY ~~LOC~~+RFID DBL (SPONGE) IMPLANT
GAUZE SPONGE 4X4 12PLY STRL (GAUZE/BANDAGES/DRESSINGS) ×2 IMPLANT
GLOVE BIOGEL PI IND STRL 6.5 (GLOVE) ×1 IMPLANT
GLOVE BIOGEL PI IND STRL 7.0 (GLOVE) ×2 IMPLANT
GLOVE SURG SS PI 6.5 STRL IVOR (GLOVE) ×1 IMPLANT
GOWN STRL REUS W/TWL LRG LVL3 (GOWN DISPOSABLE) ×2 IMPLANT
HEMOSTAT SURGICEL 4X8 (HEMOSTASIS) ×1 IMPLANT
KIT TURNOVER CYSTO (KITS) ×1 IMPLANT
LIGASURE IMPACT 36 18CM CVD LR (INSTRUMENTS) IMPLANT
MANIFOLD NEPTUNE II (INSTRUMENTS) ×1 IMPLANT
NDL HYPO 21X1.5 SAFETY (NEEDLE) ×1 IMPLANT
NEEDLE HYPO 21X1.5 SAFETY (NEEDLE) ×1 IMPLANT
NS IRRIG 1000ML POUR BTL (IV SOLUTION) ×1 IMPLANT
PACK PERI GYN (CUSTOM PROCEDURE TRAY) ×1 IMPLANT
PAD ARMBOARD POSITIONER FOAM (MISCELLANEOUS) ×1 IMPLANT
POSITIONER HEAD 8X9X4 ADT (SOFTGOODS) ×1 IMPLANT
SET BASIN LINEN APH (SET/KITS/TRAYS/PACK) ×1 IMPLANT
SPONGE SURGIFOAM ABS GEL 100 (HEMOSTASIS) ×1 IMPLANT
SURGILUBE 2OZ TUBE FLIPTOP (MISCELLANEOUS) ×1 IMPLANT
SYR 30ML LL (SYRINGE) ×1 IMPLANT
TAPE TRANSPORE STRL 2 31045 (GAUZE/BANDAGES/DRESSINGS) IMPLANT

## 2024-07-30 NOTE — Anesthesia Procedure Notes (Signed)
 Procedure Name: LMA Insertion Date/Time: 07/30/2024 10:36 AM  Performed by: Lynnetta Darrel Lebron Mickey., CRNAPre-anesthesia Checklist: Patient identified, Emergency Drugs available, Suction available and Patient being monitored Patient Re-evaluated:Patient Re-evaluated prior to induction Oxygen Delivery Method: Circle System Utilized Preoxygenation: Pre-oxygenation with 100% oxygen Induction Type: IV induction Ventilation: Mask ventilation without difficulty LMA: LMA with gastric port inserted LMA Size: 4.0 Number of attempts: 1 Placement Confirmation: positive ETCO2 Tube secured with: Tape Dental Injury: Teeth and Oropharynx as per pre-operative assessment

## 2024-07-30 NOTE — Progress Notes (Signed)
 [facility]  Spoke with the patient's family in the consultation room.  I explained that she tolerated the procedure without difficulty.  She has a roll in her anus that will come out with her first bowel movement.  I discharged her home with a prescription for narcotic pain medication that they should take as needed for pain.  I also want her taking scheduled Tylenol .  If they take the narcotic pain medication, they should take a stool softener as well.  The patient will follow-up with me in 2 weeks.    I advised them that someone from my office will call them with the pathology results next week.  It is unlikely to be me, as I am out of the office.  I will try to have someone call both the patient and the patient's husband to provide an update.  All questions were answered to their expressed satisfaction.  Dorothyann Brittle, DO [facility] [address] [place], [place] [address] [phone] (office)

## 2024-07-30 NOTE — Discharge Instructions (Signed)
 Ambulatory Surgery Discharge Instructions  General Anesthesia or Sedation Do not drive or operate heavy machinery for 24 hours.  Do not consume alcohol, tranquilizers, sleeping medications, or any non-prescribed medications for 24 hours. Do not make important decisions or sign any important papers in the next 24 hours. You should have someone with you tonight at home.  Activity  You are advised to go directly home from the hospital.  Restrict your activities and rest for a day.  Resume light activity tomorrow. No heavy lifting over 10 lbs or strenuous exercise.  Fluids and Diet Begin with clear liquids, bouillon, dry toast, soda crackers.  If not nauseated, you may go to a regular diet when you desire.  Greasy and spicy foods are not advised.  Medications  If you have not had a bowel movement in 24 hours, take 2 tablespoons over the counter Milk of mag.             You May resume your blood thinners tomorrow (Aspirin, coumadin, or other).  You are being discharged with prescriptions for Opioid/Narcotic Medications: There are some specific considerations for these medications that you should know. Opioid Meds have risks & benefits. Addiction to these meds is always a concern with prolonged use Take medication only as directed Do not drive while taking narcotic pain medication Do not crush tablets or capsules Do not use a different container than medication was dispensed in Lock the container of medication in a cool, dry place out of reach of children and pets. Opioid medication can cause addiction Do not share with anyone else (this is a felony) Do not store medications for future use. Dispose of them properly.     Disposal:  Find a [place]  household drug take back site near you.  If you can't get to a drug take back site, use the recipe below as a last resort to dispose of expired, unused or unwanted drugs. Disposal  (Do not dispose chemotherapy drugs this way, talk to your  prescribing doctor instead.) Step 1: Mix drugs (do not crush) with dirt, kitty litter, or used coffee grounds and add a small amount of water to dissolve any solid medications. Step 2: Seal drugs in plastic bag. Step 3: Place plastic bag in trash. Step 4: Take prescription container and scratch out personal information, then recycle or throw away.  Operative Site  You have a roll in your anus that will come out with your first bowel movement.  You may have pain or bleeding with bowel movements for the next 1-2 weeks. Ok to English as a second language teacher. Keep wound clean and dry. No baths or swimming. No lifting more than 10 pounds.  Contact Information: If you have questions or concerns, please call our office, [phone], Monday- Thursday 8AM-5PM and Friday 8AM-12Noon.  If it is after hours or on the weekend, please call [facility], [phone], and ask to speak to the surgeon on call for Dr. Evonnie at [facility].   SPECIFIC COMPLICATIONS TO WATCH FOR: Inability to urinate Fever over 101? F by mouth Nausea and vomiting lasting longer than 24 hours. Pain not relieved by medication ordered Swelling around the operative site Increased redness, warmth, hardness, around operative area Numbness, tingling, or cold fingers or toes Blood -soaked dressing, (small amounts of oozing may be normal) Increasing and progressive drainage from surgical area or exam site

## 2024-07-30 NOTE — Interval H&P Note (Signed)
 History and Physical Interval Note:  07/30/2024 10:15 AM  Mackenzie Nichols  has presented today for surgery, with the diagnosis of MASS OF ANUS.  The various methods of treatment have been discussed with the patient and family. After consideration of risks, benefits and other options for treatment, the patient has consented to  Procedure(s): EXAM UNDER ANESTHESIA, RECTUM ([name]) BIOPSY, RECTUM ([name]) as a surgical intervention.  The patient's history has been reviewed, patient examined, no change in status, stable for surgery.  I have reviewed the patient's chart and labs.  Questions were answered to the patient's satisfaction.     Brean Carberry A Naftoli Penny

## 2024-07-30 NOTE — Anesthesia Postprocedure Evaluation (Signed)
 Anesthesia Post Note  Patient: Mackenzie Nichols  Procedure(s) Performed: ERASMO UNDER ANESTHESIA, RECTUM (Rectum) BIOPSY, RECTUM (Rectum)  Patient location during evaluation: PACU Anesthesia Type: General Level of consciousness: awake and alert Pain management: pain level controlled Vital Signs Assessment: post-procedure vital signs reviewed and stable Respiratory status: spontaneous breathing, nonlabored ventilation, respiratory function stable and patient connected to nasal cannula oxygen Cardiovascular status: blood pressure returned to baseline and stable Postop Assessment: no apparent nausea or vomiting Anesthetic complications: no   There were no known notable events for this encounter.   Last Vitals:  Vitals:   07/30/24 1130 07/30/24 1145  BP: 129/87 (!) 123/91  Pulse:  70  Resp: 18 14  Temp:    SpO2: 96% 96%    Last Pain:  Vitals:   07/30/24 1117  TempSrc:   PainSc: Asleep                 Susi Goslin L Taliana Mersereau

## 2024-07-30 NOTE — Transfer of Care (Signed)
 Immediate Anesthesia Transfer of Care Note  Patient: Mackenzie Nichols  Procedure(s) Performed: ERASMO UNDER ANESTHESIA, RECTUM (Rectum) BIOPSY, RECTUM (Rectum)  Patient Location: PACU  Anesthesia Type:General  Level of Consciousness: drowsy   Airway & Oxygen Therapy: Patient Spontanous Breathing and Patient connected to face mask oxygen  Post-op Assessment: Report given to RN and Post -op Vital signs reviewed and stable  Post vital signs: Reviewed and stable  Last Vitals:  Vitals Value Taken Time  BP 123/71 07/30/24 11:17  Temp    Pulse 70 07/30/24 11:20  Resp 13 07/30/24 11:20  SpO2 100 % 07/30/24 11:20  Vitals shown include unfiled device data.  Last Pain:  Vitals:   07/30/24 0847  TempSrc: Oral  PainSc: 0-No pain      Patients Stated Pain Goal: 3 (07/30/24 0847)  Complications: No notable events documented.

## 2024-07-30 NOTE — Op Note (Signed)
 [facility] Operative Note  07/30/24  Preoperative Diagnosis: Anal mass   Postoperative Diagnosis: Same   Procedure(s) Performed: Anal exam under anesthesia, biopsy of anal mass; pudendal nerve block   Surgeon: Dorothyann Brittle, DO    Assistants: No qualified resident was available    Anesthesia: LMA   Anesthesiologist: Landry Dunnings, MD    Specimens: biopsy of anal mass   Estimated Blood Loss: Minimal   Blood Replacement: None    Complications: None   Wound Class: Contaminated   Operative Indications: Patient is a 68 year old female who presents for anal exam under anesthesia and biopsy of anal mass.  She was seen by her gynecologist, who noted that she had an ulcerated mass at her anus.  She has only had some bleeding with bowel movements, and denies pain.  She is agreeable to surgery at this time.  All risks and benefits of performing this procedure were discussed with the patient including pain, infection, bleeding, damage to the surrounding structures, and need for more procedures or surgery. The patient voiced understanding of the procedure, all questions were sought and answered, and consent was obtained.  Findings: - Large hemorrhoid from 6 o'clock position to 9-10 o'clock position. Associated nodularity to the hemorrhoid with an ulcerated  polypoid mass.  Polypoid mass removed in addition to a couple nodules   Procedure: The patient was taken to the operating room and placed supine. LMA anesthesia was induced. Intravenous antibiotics were administered per protocol.  Patient was placed in lithotomy. The perineum and anus were prepared and draped in the usual sterile fashion.  A time-out was completed verifying correct patient, procedure, site, positioning, and implant(s) and/or special equipment prior to beginning this procedure.  The anal speculum was inserted.  Patient has a large hemorrhoid that spans from the 6 o'clock position to the 9 to 10  o'clock position.  This hemorrhoid has some associated nodularity overlying it with an ulcerated polypoid mass within the anus.  The polypoid mass was removed using LigaSure.  2 of the nodules were also removed with Metzenbaum scissors.  The specimens were sent to pathology for evaluation.  Hemostasis was achieved with electrocautery.  Decision was made to leave this large hemorrhoid in place, especially since the patient is not having pain, and the purpose of this exam was for tissue diagnosis.  Pudendal nerve block was performed by injecting Marcaine  at the bilateral ischial tuberosities.  A gelfoam roll with Surgicell covered in americaine ointment was then inserted into the anus.  Mesh panties were placed.   Final inspection revealed acceptable hemostasis. All counts were correct at the end of the case. The patient was awakened from anesthesia without complication.  The patient went to the PACU in stable condition.   Dorothyann Brittle, DO  [facility] [address] [place], [place] [address] [phone] (office)

## 2024-07-30 NOTE — Anesthesia Preprocedure Evaluation (Addendum)
 Anesthesia Evaluation  Patient identified by MRN, date of birth, ID band Patient awake    Reviewed: Allergy & Precautions, H&P , NPO status , Patient's Chart, lab work & pertinent test results, reviewed documented beta blocker date and time   Airway Mallampati: II  TM Distance: >3 FB Neck ROM: full    Dental  (+) Dental Advisory Given, Partial Upper, Partial Lower   Pulmonary neg pulmonary ROS   Pulmonary exam normal breath sounds clear to auscultation       Cardiovascular Exercise Tolerance: Good hypertension, Normal cardiovascular exam Rhythm:regular Rate:Normal     Neuro/Psych negative neurological ROS  negative psych ROS   GI/Hepatic negative GI ROS, Neg liver ROS,,,  Endo/Other  diabetes, Poorly Controlled, Type 2  Class 3 obesity  Renal/GU negative Renal ROS  negative genitourinary   Musculoskeletal   Abdominal  (+) + obese  Peds  Hematology negative hematology ROS (+)   Anesthesia Other Findings   Reproductive/Obstetrics negative OB ROS                              Anesthesia Physical Anesthesia Plan  ASA: 3  Anesthesia Plan: General   Post-op Pain Management: Minimal or no pain anticipated   Induction: Intravenous  PONV Risk Score and Plan: Ondansetron , Dexamethasone , Midazolam  and Scopolamine  patch - Pre-op  Airway Management Planned: LMA and Oral ETT  Additional Equipment: None  Intra-op Plan:   Post-operative Plan:   Informed Consent: I have reviewed the patients History and Physical, chart, labs and discussed the procedure including the risks, benefits and alternatives for the proposed anesthesia with the patient or authorized representative who has indicated his/her understanding and acceptance.     Dental Advisory Given  Plan Discussed with: CRNA  Anesthesia Plan Comments:          Anesthesia Quick Evaluation

## 2024-07-31 ENCOUNTER — Encounter (HOSPITAL_COMMUNITY): Payer: Self-pay | Admitting: Surgery

## 2024-08-03 ENCOUNTER — Telehealth: Payer: Self-pay | Admitting: *Deleted

## 2024-08-03 LAB — SURGICAL PATHOLOGY

## 2024-08-03 NOTE — Telephone Encounter (Signed)
 Surgical Date: 07/30/2024 Procedure: Exam Under Anesthesia, Rectum/ Biopsy, Rectum  Received call from patient [phone]~ telephone.  Patient reports that prescription for ointment was not sent to pharmacy. Noted that Americaine was sent to pharmacy and pharmacy confirmed receipt.   Call placed to pharmacy. Was advised that medication is not covered by insurance. No alternatives offered.   Call placed to patient. Advised to use OTC hydrocortisone cream. Patient states that she has cream on hand.

## 2024-08-04 ENCOUNTER — Telehealth: Payer: Self-pay | Admitting: *Deleted

## 2024-08-04 ENCOUNTER — Ambulatory Visit: Admitting: Family Medicine

## 2024-08-04 NOTE — Telephone Encounter (Signed)
 Patient returned call and made aware.   Patient is very grateful to provider for her care.

## 2024-08-04 NOTE — Telephone Encounter (Signed)
 Patient was identified as falling into the [place] Measure - Diabetes.   Patient was: Appointment already scheduled for:  08/27/24. With PCP

## 2024-08-04 NOTE — Telephone Encounter (Signed)
 Surgical pathology of rectal biopsy as follows: FINAL MICROSCOPIC DIAGNOSIS:   A. ANAL MASS, BIOPSY:  - Benign colonic mucosa with hyperplastic change, granulation tissue and  prolapse effect consistent with solitary rectal ulcer  - No malignancy identified   Discussed results with Dr. Mavis.   Call placed to patient to notify her of results. LMTRC.

## 2024-08-05 LAB — GLUCOSE, CAPILLARY: Glucose-Capillary: 169 mg/dL — ABNORMAL HIGH (ref 70–99)

## 2024-08-12 ENCOUNTER — Other Ambulatory Visit: Payer: Self-pay

## 2024-08-12 ENCOUNTER — Encounter: Payer: Self-pay | Admitting: Obstetrics and Gynecology

## 2024-08-12 MED ORDER — PRENATAL 28-0.8 MG PO TABS: 1.0000 | ORAL_TABLET | Freq: Every day | ORAL | 12 refills | Status: AC

## 2024-08-15 DIAGNOSIS — E1165 Type 2 diabetes mellitus with hyperglycemia: Secondary | ICD-10-CM | POA: Diagnosis not present

## 2024-08-15 DIAGNOSIS — I10 Essential (primary) hypertension: Secondary | ICD-10-CM | POA: Diagnosis not present

## 2024-08-17 ENCOUNTER — Encounter: Payer: Self-pay | Admitting: Surgery

## 2024-08-17 ENCOUNTER — Ambulatory Visit (INDEPENDENT_AMBULATORY_CARE_PROVIDER_SITE_OTHER): Admitting: Surgery

## 2024-08-17 VITALS — BP 148/85 | HR 62 | Temp 97.8°F | Resp 12 | Ht 62.0 in | Wt 235.0 lb

## 2024-08-17 DIAGNOSIS — Z09 Encounter for follow-up examination after completed treatment for conditions other than malignant neoplasm: Secondary | ICD-10-CM

## 2024-08-17 NOTE — Progress Notes (Unsigned)
 [facility] Note   HPI:  68 y.o. Female presents to clinic for post-op follow-up status post anal exam under anesthesia and biopsy of anal mass with pudendal nerve block on 8/15.  Patient has been doing well since the surgery.  She is notes a little bit of blood with bowel movements, but denies any pain.  She is moving her bowels without issue.  She denies fevers and chills.  Review of Systems:  All other review of systems: otherwise negative   Vital Signs:  BP (!) 148/85   Pulse 62   Temp 97.8 F (36.6 C) (Oral)   Resp 12   Ht 5' 2 (1.575 m)   Wt 235 lb (106.6 kg)   LMP 06/15/2013   SpO2 97%   BMI 42.98 kg/m    Physical Exam:  Physical Exam Vitals reviewed.  Constitutional:      Appearance: Normal appearance.  Genitourinary:    Comments: Anus with external hemorrhoids noted, no significant inflammatory changes, nontender Neurological:     Mental Status: She is alert.     Laboratory studies: None  Imaging:  None  Pathology: A. ANAL MASS, BIOPSY:  - Benign colonic mucosa with hyperplastic change, granulation tissue and  prolapse effect consistent with solitary rectal ulcer  - No malignancy identified    Assessment:  68 y.o. yo Female who presents for follow-up status post anal exam under anesthesia and biopsy of anal mass with pudendal nerve block on 8/15  Plan:  - We discussed her pathology results and that these are all benign findings.  For this reason, she needs no further workup regarding this mass.  This was likely an ulcerative area that was exposed to the air and caused it to become more prominent - Recommend that she undergo screening colonoscopies as previously scheduled - Follow up with me as needed  All of the above recommendations were discussed with the patient, and all of patient's questions were answered to her expressed satisfaction.  Note: Portions of this report may have been transcribed using voice recognition software.  Every effort has been made to ensure accuracy; however, inadvertent computerized transcription errors may still be present.   Dorothyann Brittle, DO [facility] [address] [place], [place] [address] [phone] (office)

## 2024-08-27 ENCOUNTER — Ambulatory Visit: Admitting: Family Medicine

## 2024-09-03 ENCOUNTER — Ambulatory Visit (INDEPENDENT_AMBULATORY_CARE_PROVIDER_SITE_OTHER): Admitting: Family Medicine

## 2024-09-03 VITALS — BP 124/82

## 2024-09-03 DIAGNOSIS — E119 Type 2 diabetes mellitus without complications: Secondary | ICD-10-CM | POA: Diagnosis not present

## 2024-09-03 MED ORDER — SEMAGLUTIDE(0.25 OR 0.5MG/DOS) 2 MG/1.5ML ~~LOC~~ SOPN: 0.2500 mg | PEN_INJECTOR | SUBCUTANEOUS | 1 refills | Status: DC

## 2024-09-03 NOTE — Progress Notes (Signed)
   SUBJECTIVE:   CHIEF COMPLAINT / HPI:  NAJMO PARDUE is a 68 y.o. female with a pertinent past medical history of T2DM, HLD, HTN presenting to the clinic for T2DM follow up after A1c showed increase to 9.4 from 7.7 in May 2025.  Type 2 diabetes mellitus - Last A1c 9.4 on 8/12, received message from General Surgery noting this finding - Medications: NOT taking metformin  500 mg - Eye exam: UTD - Foot exam: UTD - Microalbumin: UTD - Statin: Atorvastatin  80 mg - No symptoms of hypoglycemia, polyuria, polydipsia, numbness of extremities, foot ulcers/trauma - No history of pancreatitis, medullary thyroid cancers in self or family    PERTINENT PMH / PSH: Recent anal mass biopsy 8/15 benign   *Remainder reviewed in problem list.   OBJECTIVE:   BP 124/82   LMP 06/15/2013   General: Age-appropriate, resting comfortably in chair, NAD, alert and at baseline. HEENT: Smooth nontender thyroid. Cardiovascular: Regular rate and rhythm. Normal S1/S2. No murmurs, rubs, or gallops appreciated. 2+ radial pulses. Abdominal: No tenderness to deep or light palpation. No rebound or guarding. No HSM. Skin: Warm and dry.  Acanthosis. Extremities: No peripheral edema bilaterally. Capillary refill <2 seconds.   ASSESSMENT/PLAN:   Assessment & Plan Type 2 diabetes mellitus without complication, unspecified whether long term insulin use (HCC) Diabetes poorly controlled A1c 9.4 but fortunately without sequelae.  Suspect discontinuation of metformin  may be related to patient's jump in A1c, however is poorly controlled prior to discontinuing metformin  regardless.  No contraindication to GLP-1, believe patient would significant benefit from this medication. - Start Ozempic  0.25 mg daily - If PA denied for GLP-1, will proceed with Jardiance instead - Discussed potential side effects of Ozempic , recommended patient return to clinic or call if experiencing significant nausea or vomiting - Provided  education and demonstration of injector pen use and technique together, patient performed teach-back - Discussed dietary and exercise interventions including eliminating sugary beverages from diet, increasing exercise to 30 minutes daily of brisk pace walking  Follow-up in 1 to 2 months.  Johntay Doolen Toma, MD [facility]

## 2024-09-03 NOTE — Assessment & Plan Note (Addendum)
 Diabetes poorly controlled A1c 9.4 but fortunately without sequelae.  Suspect discontinuation of metformin  may be related to patient's jump in A1c, however is poorly controlled prior to discontinuing metformin  regardless.  No contraindication to GLP-1, believe patient would significant benefit from this medication. - Start Ozempic  0.25 mg daily - If PA denied for GLP-1, will proceed with Jardiance instead - Discussed potential side effects of Ozempic , recommended patient return to clinic or call if experiencing significant nausea or vomiting - Provided education and demonstration of injector pen use and technique together, patient performed teach-back - Discussed dietary and exercise interventions including eliminating sugary beverages from diet, increasing exercise to 30 minutes daily of brisk pace walking

## 2024-09-03 NOTE — Patient Instructions (Addendum)
 It was great to see you today! Thank you for choosing [facility] for your primary care.  Today we addressed: 1. Diabetes I have prescribed you Ozempic .  IF this medicine is approved, we will let you know.  If it is not approved, I will prescribe you another medicine called Jardiance.  Start 0.25 mg of Ozempic  weekly for 1 month. This medication may cause stomach upset, queasiness, or constipation, especially when first starting. This generally improves over time. Call our office if these symptoms occur and worsen, or if you have severe symptoms such as vomiting, diarrhea, or stomach pain.  You should return to our clinic in 3 months for diabetes follow up.  Thank you for coming to see us  at [facility] and for the opportunity to care for you! Anala Whisenant, MD 09/03/2024, 3:13 PM

## 2024-09-12 ENCOUNTER — Ambulatory Visit (HOSPITAL_COMMUNITY)
Admission: EM | Admit: 2024-09-12 | Discharge: 2024-09-12 | Disposition: A | Discharge status: Home / Self Care | Payer: Self-pay | Attending: Emergency Medicine | Admitting: Emergency Medicine

## 2024-09-12 ENCOUNTER — Encounter (HOSPITAL_COMMUNITY): Payer: Self-pay

## 2024-09-12 DIAGNOSIS — M545 Low back pain, unspecified: Secondary | ICD-10-CM

## 2024-09-12 MED ORDER — BACLOFEN 10 MG PO TABS: 10.0000 mg | ORAL_TABLET | Freq: Three times a day (TID) | ORAL | 0 refills | Status: AC
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
## 2024-09-12 NOTE — Discharge Instructions (Signed)
 You can take baclofen every 8 hours as needed for muscle pain and spasms. You can also take 500 to 1000 mg of Tylenol  every 6-8 hours as needed for pain.  Do not exceed 4000 mg in 1 day. Alternate between ice and heat and do some gentle stretching. Follow-up with your primary care provider or return here as needed.
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
## 2024-09-16 ENCOUNTER — Telehealth: Payer: Self-pay

## 2024-09-16 NOTE — Telephone Encounter (Signed)
 Patient calls nurse line requesting update on approval of Ozempic .   Will forward to [place] for assistance on PA.   Chiquita JAYSON English, RN

## 2024-09-17 ENCOUNTER — Other Ambulatory Visit (HOSPITAL_COMMUNITY): Payer: Self-pay

## 2024-09-20 NOTE — Telephone Encounter (Signed)
 Attempted to call patient to provide update.   She did not answer, VM not set up. If/when patient returns call, please advise her of message per [place].   Chiquita JAYSON English, RN

## 2024-09-27 NOTE — Telephone Encounter (Signed)
 Patient LVM on nurse line regarding status of Ozempic .   Returned call to patient. She did not answer, unable to LVM.   Chiquita JAYSON English, RN

## 2024-10-21 ENCOUNTER — Encounter: Payer: Self-pay | Admitting: Obstetrics and Gynecology

## 2024-10-26 ENCOUNTER — Ambulatory Visit: Admitting: Advanced Practice Midwife

## 2024-11-01 ENCOUNTER — Ambulatory Visit (INDEPENDENT_AMBULATORY_CARE_PROVIDER_SITE_OTHER): Admitting: Family Medicine

## 2024-11-01 ENCOUNTER — Encounter: Payer: Self-pay | Admitting: Family Medicine

## 2024-11-01 VITALS — BP 148/88 | HR 97 | Temp 98.3°F | Ht 62.0 in | Wt 228.2 lb

## 2024-11-01 DIAGNOSIS — B372 Candidiasis of skin and nail: Secondary | ICD-10-CM | POA: Diagnosis not present

## 2024-11-01 DIAGNOSIS — I10 Essential (primary) hypertension: Secondary | ICD-10-CM | POA: Diagnosis not present

## 2024-11-01 MED ORDER — NYSTATIN 100000 UNIT/GM EX CREA: 1.0000 | TOPICAL_CREAM | Freq: Two times a day (BID) | CUTANEOUS | 1 refills | Status: AC

## 2024-11-01 NOTE — Assessment & Plan Note (Signed)
 BP slightly elevated today Advised to make appt with PCP to evaluate BP regimen/recheck

## 2024-11-01 NOTE — Progress Notes (Signed)
    SUBJECTIVE:   CHIEF COMPLAINT / HPI:   Rash to groin and under breast x1.5 months Very itchy Reports that she does sweat in these areas No hx similar rash  PERTINENT  PMH / PSH: T2DM, HTN  OBJECTIVE:   BP (!) 148/88   Pulse 97   Temp 98.3 F (36.8 C) (Oral)   Ht 5' 2 (1.575 m)   Wt 228 lb 4 oz (103.5 kg)   LMP 06/15/2013   SpO2 100%   BMI 41.75 kg/m   General: well appearing, NAD Skin: Mackenzie Nichols, CMA present. Moist erythematous patch to folds of groin, under pannus, and under bilateral breasts with scattered satellite papules  ASSESSMENT/PLAN:   Assessment & Plan Candidal intertrigo Rash consistent with Candida.  Nystatin powder preferred but required prior Auth, so proceeded with nystatin cream.  Advised to keep the area very dry, wear loosefitting clothes.  Return if sx do not start to improve by the end of the week Essential hypertension, benign BP slightly elevated today Advised to make appt with PCP to evaluate BP regimen/recheck    Elyce Prescott, DO [facility]

## 2024-11-01 NOTE — Patient Instructions (Addendum)
 Good to see you today - Thank you for coming in  Things we discussed today:  You have a yeast infection of your skin.  I have prescribed you a cream to treat this, you will apply it twice a day all over the rash until it resolves.  If your symptoms do not start to improve by the end of the week please let us  know. We can use over-the-counter hydrocortisone as needed for itching.  It is very important to keep the areas dry.  Keep your skin clean and dry. Wear loose-fitting clothing. If you have diabetes, keep your blood sugar under control. Keep all follow-up visits. This is important.  Contact a health care provider if: Your symptoms go away and then come back. Your symptoms do not get better with treatment. Your symptoms get worse. Your rash spreads. You have a fever or chills. You have new symptoms. You have new warmth or redness of your skin. Your rash is painful or bleeding.

## 2024-11-04 ENCOUNTER — Other Ambulatory Visit: Payer: Self-pay

## 2024-11-04 DIAGNOSIS — E119 Type 2 diabetes mellitus without complications: Secondary | ICD-10-CM

## 2024-11-04 MED ORDER — SEMAGLUTIDE(0.25 OR 0.5MG/DOS) 2 MG/1.5ML ~~LOC~~ SOPN: 0.2500 mg | PEN_INJECTOR | SUBCUTANEOUS | 1 refills | Status: DC

## 2024-11-23 ENCOUNTER — Telehealth: Payer: Self-pay

## 2024-11-23 NOTE — Telephone Encounter (Signed)
 Patient was identified as falling into the [place] Measure - Diabetes.   Patient was: Requires a call back at a later time. No answer and no VM.

## 2024-12-20 ENCOUNTER — Telehealth: Payer: Self-pay

## 2024-12-20 NOTE — Telephone Encounter (Signed)
 Patient calls nurse line regarding concerns with continued rash.   She reports that she has tried using the nystatin  cream that was prescribed, however, she continues to have itchy rash.   She is asking if stronger alternative can be prescribed.   Please advise.   Chiquita JAYSON English, RN

## 2024-12-21 ENCOUNTER — Ambulatory Visit: Payer: Self-pay

## 2024-12-21 NOTE — Telephone Encounter (Signed)
 Patient returns call to nurse line.   Advised she would need to be re-evaluated in clinic.   Patient reports she needs the latest apt of the day.   Patient scheduled for 4:10p this afternoon.

## 2024-12-24 ENCOUNTER — Encounter: Payer: Self-pay | Admitting: Family Medicine

## 2024-12-24 ENCOUNTER — Ambulatory Visit: Admitting: Family Medicine

## 2024-12-24 VITALS — BP 150/89 | HR 92 | Ht 62.0 in | Wt 231.2 lb

## 2024-12-24 DIAGNOSIS — E119 Type 2 diabetes mellitus without complications: Secondary | ICD-10-CM

## 2024-12-24 DIAGNOSIS — B372 Candidiasis of skin and nail: Secondary | ICD-10-CM | POA: Diagnosis not present

## 2024-12-24 DIAGNOSIS — I10 Essential (primary) hypertension: Secondary | ICD-10-CM

## 2024-12-24 MED ORDER — FLUCONAZOLE 150 MG PO TABS: 150.0000 mg | ORAL_TABLET | Freq: Once | ORAL | 0 refills | Status: AC

## 2024-12-24 MED ORDER — SEMAGLUTIDE(0.25 OR 0.5MG/DOS) 2 MG/1.5ML ~~LOC~~ SOPN: 0.2500 mg | PEN_INJECTOR | SUBCUTANEOUS | 1 refills | Status: AC

## 2024-12-24 MED ORDER — HYDROCHLOROTHIAZIDE 25 MG PO TABS: 25.0000 mg | ORAL_TABLET | Freq: Every day | ORAL | 3 refills | Status: DC

## 2024-12-24 MED ORDER — CLOTRIMAZOLE 1 % EX SOLN: 1.0000 | Freq: Two times a day (BID) | CUTANEOUS | 0 refills | Status: AC

## 2024-12-24 NOTE — Patient Instructions (Addendum)
 It was wonderful to see you today.  Please bring ALL of your medications with you to every visit.   For your intertrigo (rash) - I have switched your topical ointment to a medicine called clotrimazole . You can stop the nystatin  and try using this instead. I have also prescribed you a one time dose of a tablet called diflucan .   For your blood pressure, it is high and uncontrolled. Make sure you take your losartan  EVERY DAY. This way we will not need to start you on an additional medication. You will need to follow up in 2 weeks to check your blood pressure and the rash.   For your diabetes, I have sent another prescription of the ozempic . Please start this.   Thank you for choosing [facility].   Please call [phone] with any questions about today's appointment.  Please be sure to schedule follow up at the front desk before you leave today.   Areta Saliva, MD  Family Medicine

## 2024-12-24 NOTE — Assessment & Plan Note (Signed)
 Uncontrolled.  Most likely due to medication nonadherence. - Counseled patient on importance of medication adherence continue losartan  - Follow-up in 2 weeks with PCP

## 2024-12-24 NOTE — Progress Notes (Signed)
" ° °  SUBJECTIVE:   CHIEF COMPLAINT / HPI:  Discussed the use of AI scribe software for clinical note transcription with the patient, who gave verbal consent to proceed.  History of Present Illness Mackenzie Nichols is a 69 year old female with diabetes who presents with a persistent rash.  Cutaneous rash - Persistent pruritic and painful rash since November located under the breasts and inguinal creases - Rash is very dark in color with satellite regions and erythema - Patient started using nystatin  cream after her appointment in November but then ran out. - Grand daughters triamcinolone  added without resolution of rash - Tried to use baby wash as a sensitive wash. - Patient then went back to using nystatin  cream without any relief.  Glycemic control - Diabetes mellitus with hemoglobin A1c of 9.4 approximately 5 months ago - Discontinued metformin  several months ago due to perceived lack of efficacy.  Does not want to go back on it. - Unable to afford semaglutide  previously due to insurance limitations - Recently switched to [employer] and hopes for semaglutide  coverage  Hypertension - Patient says she is only intermittently taking her losartan . - Does not have a reason for not taking it every night says that she just does not want to/forgets    PERTINENT  PMH / PSH: T2DM, HTN  OBJECTIVE:  BP 133/85   Pulse 92   Ht 5' 2 (1.575 m)   Wt 231 lb 3.2 oz (104.9 kg)   LMP 06/15/2013   SpO2 100%   BMI 42.29 kg/m   General: well appearing, in no acute distress Resp: Normal work of breathing on room air Derm: Skin underneath the breasts hyperpigmented plaques with satellite lesions cascading down with surrounding erythema, patient declined perineal exam but says that her inguinal crease has similar appearance.  ASSESSMENT/PLAN:   Assessment & Plan Type 2 diabetes mellitus without complications, unspecified whether long term insulin use (HCC) Previous A1c of 9.4.   Uncontrolled.  Patient was not taking any medications given that she stopped her metformin  and did not take the prescribed semaglutide  given that her insurance previously did not cover it fully.  It was not affordable insurance would pay only portion and patient would have to pay 500 to thousand dollars. - A1c today - Patient is taking her cholesterol medication - Reordered Ozempic  given insurance change Essential hypertension, benign Uncontrolled.  Most likely due to medication nonadherence. - Counseled patient on importance of medication adherence continue losartan  - Follow-up in 2 weeks with PCP Candidal intertrigo Persistent candidiasis intertrigo under the breast and in inguinal creases unresponsive to nystatin  cream.  Patient is significantly symptomatic.  Most likely due to her uncontrolled diabetes. - Counseled patient on risk due to her uncontrolled diabetes - Diflucan  150 mg x 1 - Clotrimazole  external solution - Follow-up in 2 weeks with PCP  Areta Saliva, MD [name] "

## 2024-12-24 NOTE — Assessment & Plan Note (Signed)
 Previous A1c of 9.4.  Uncontrolled.  Patient was not taking any medications given that she stopped her metformin  and did not take the prescribed semaglutide  given that her insurance previously did not cover it fully.  It was not affordable insurance would pay only portion and patient would have to pay 500 to thousand dollars. - A1c today - Patient is taking her cholesterol medication - Reordered Ozempic  given insurance change

## 2025-01-03 ENCOUNTER — Telehealth: Payer: Self-pay

## 2025-01-03 ENCOUNTER — Other Ambulatory Visit (HOSPITAL_COMMUNITY): Payer: Self-pay

## 2025-01-03 NOTE — Telephone Encounter (Signed)
 Pharmacy Patient Advocate Encounter  Received notification from [facility] MEDICARE that Prior Authorization for OZEMPIC  has been APPROVED from 01/03/25 to 12/15/25   PA #/Case ID/Reference #: [id number]

## 2025-01-03 NOTE — Telephone Encounter (Signed)
 Pharmacy Patient Advocate Encounter   Received notification from [facility] KEY that prior authorization for OZEMPIC  0.25/0.5MG  is required/requested.   Insurance verification completed.   The patient is insured through [facility].   Per test claim: PA required; PA submitted to above mentioned insurance via Latent Key/confirmation #/EOC [id number]. Status is pending

## 2025-01-06 ENCOUNTER — Ambulatory Visit: Payer: Self-pay

## 2025-01-06 ENCOUNTER — Encounter: Payer: Self-pay | Admitting: Family Medicine

## 2025-01-06 VITALS — BP 129/59 | HR 89 | Ht 62.0 in | Wt 230.8 lb

## 2025-01-06 DIAGNOSIS — I10 Essential (primary) hypertension: Secondary | ICD-10-CM

## 2025-01-06 DIAGNOSIS — E119 Type 2 diabetes mellitus without complications: Secondary | ICD-10-CM | POA: Diagnosis not present

## 2025-01-06 DIAGNOSIS — Z6841 Body Mass Index (BMI) 40.0 and over, adult: Secondary | ICD-10-CM | POA: Diagnosis not present

## 2025-01-06 LAB — POCT GLYCOSYLATED HEMOGLOBIN (HGB A1C): HbA1c, POC (controlled diabetic range): 9.5 % — AB (ref 0.0–7.0)

## 2025-01-06 MED ORDER — GLYBURIDE MICRONIZED 1.5 MG PO TABS: 1.5000 mg | ORAL_TABLET | Freq: Every day | ORAL | 0 refills | Status: DC

## 2025-01-06 NOTE — Progress Notes (Addendum)
 error

## 2025-01-06 NOTE — Assessment & Plan Note (Signed)
 Well-controlled this visit, denies any symptoms of low blood pressure.  Reports that she is on a diuretic but I believe she is referring to the losartan .  Reports she has been taking it daily and has had no difficulties filling the medication. Continue losartan  50 mg nightly

## 2025-01-06 NOTE — Patient Instructions (Signed)
 Thank you for visiting the clinic today, it was good to see you!  Please always bring your medication bottles  In today's visit we discussed:  Diabetes: I have you scheduled next Friday morning to see Dr. Koval for follow up on diabetes. We have started you on Glybase, take this every morning and continue to check your blood sugars every morning before breakfast and 1 hour after every meal. Bring the recorded numbers to your next visit.  Blood pressure: Great work, and keep taking the medication. Your blood pressure is at goal!  Please follow-up in 1 week.  For any questions, please call the office at [phone] or send me a message in MyChart. Have a great day!  -Fairy Amy, MD  [facility] Resident, PGY-1

## 2025-01-06 NOTE — Progress Notes (Signed)
" ° ° °  SUBJECTIVE:   CHIEF COMPLAINT / HPI:   T2DM: Has still not heard from insurance regarding ozempic  yet. Was taking metformin  daily for about a year but stopped. Had some GI upset and stopped taking approximately 3 months. Stopped taking the metformin  because she reports that it had no benefit for her blood sugar. Has been taking Berberine. Insurance not approving Jardiance. Would not like nutrition counseling because, she already knows what I need to do. Has eaten a lot sweets in the past. Blood glucose have been aroundn 200's at home but can't remember well.  HTN: Reports that she started taking the prescribed medication from previous visit. No dizziness, light headed, LOC, or feeling unwell.  PERTINENT  PMH / PSH: Morbid obesity, hypercholesterolemia, fibroid uterus, benign vaginal neoplasm  OBJECTIVE:   BP (!) 129/59   Pulse 89   Ht 5' 2 (1.575 m)   Wt 230 lb 12.8 oz (104.7 kg)   LMP 06/15/2013   SpO2 100%   BMI 42.21 kg/m     Cardiac: Regular rate and rhythm. Normal S1/S2. No murmurs, rubs, or gallops appreciated. Lungs: Clear bilaterally to ascultation.  Abdomen: Normoactive bowel sounds. No tenderness to deep or light palpation. No rebound or guarding.   Psych: Pleasant and appropriate    ASSESSMENT/PLAN:   Assessment & Plan Type 2 diabetes mellitus without complication, without long-term current use of insulin (HCC) Poorly controlled, A1c today 9.5.  Patient reports that she has no interest in taking the metformin  as it did not work and listed other herbal supplements that she believes will be beneficial.  Educated the patient regarding routine care and safety of metformin  and patient still expressed no desire to resume metformin  therapy.  Discussed other modalities and importance of treatment and adequate control of diabetes and patient confirmed understanding.  Denied any symptoms of hyperglycemia, very poor blood sugar record from home but patient reports it is  around the 200s.  She reports that she is still waiting for the semaglutide  to be approved with her new insurance Continue blood sugar monitoring and fasting and 1 hour postprandial Start glyburide  1.5 mg every morning Tried to prescribe Jardiance but was notified that it would cost nearly $500 for 90 day fill Referral placed to Dr. Koval to discuss further diabetes management and possible initiation of long-acting insulin. Patient refused referral for diabetic nutrition counseling though I do think this would benefit her in the long run. Camille regarding insurance authorization and possible financial aid assistance Body mass index (BMI) 40.0-44.9, adult (HCC) Provided extensive diet counseling, as patient has remained stable for last several months.  Had good insight into proper diet, and is willing to cut out more simple carbs. Essential hypertension, benign Well-controlled this visit, denies any symptoms of low blood pressure.  Reports that she is on a diuretic but I believe she is referring to the losartan .  Reports she has been taking it daily and has had no difficulties filling the medication. Continue losartan  50 mg nightly     Sirenity Shew, MD [facility] "

## 2025-01-06 NOTE — Assessment & Plan Note (Signed)
 Poorly controlled, A1c today 9.5.  Patient reports that she has no interest in taking the metformin  as it did not work and listed other herbal supplements that she believes will be beneficial.  Educated the patient regarding routine care and safety of metformin  and patient still expressed no desire to resume metformin  therapy.  Discussed other modalities and importance of treatment and adequate control of diabetes and patient confirmed understanding.  Denied any symptoms of hyperglycemia, very poor blood sugar record from home but patient reports it is around the 200s.  She reports that she is still waiting for the semaglutide  to be approved with her new insurance Continue blood sugar monitoring and fasting and 1 hour postprandial Start glyburide  1.5 mg every morning Tried to prescribe Jardiance but was notified that it would cost nearly $500 for 90 day fill Referral placed to Dr. Koval to discuss further diabetes management and possible initiation of long-acting insulin. Patient refused referral for diabetic nutrition counseling though I do think this would benefit her in the long run. Camille regarding insurance authorization and possible financial aid assistance

## 2025-01-07 ENCOUNTER — Other Ambulatory Visit (HOSPITAL_COMMUNITY): Payer: Self-pay

## 2025-01-10 ENCOUNTER — Encounter: Payer: Self-pay | Admitting: Pharmacist

## 2025-01-10 NOTE — Progress Notes (Signed)
 This patient is appearing on a report for being at risk of failing the adherence measure for hypertension (ACEi/ARB) medications this calendar year.   Medication: losartan  Last fill date: 10/13/2024 for 90 day supply  Reviewed medication indication, dosing, and goals of therapy.    This patient is appearing on a report for being at risk of failing the adherence measure for cholesterol (statin) medications this calendar year.   Medication: atorvastatin  Last fill date: 01/10/2025 for 90 day supply  Reviewed medication indication, dosing, and goals of therapy.     Appears adherent per Dr. Annemarie.

## 2025-01-13 ENCOUNTER — Other Ambulatory Visit

## 2025-01-14 ENCOUNTER — Ambulatory Visit: Admitting: Pharmacist

## 2025-01-19 ENCOUNTER — Other Ambulatory Visit: Payer: Self-pay

## 2025-01-19 DIAGNOSIS — E119 Type 2 diabetes mellitus without complications: Secondary | ICD-10-CM

## 2025-01-19 MED ORDER — GLIPIZIDE ER 2.5 MG PO TB24: 2.5000 mg | ORAL_TABLET | Freq: Every day | ORAL | 3 refills | Status: AC

## 2025-01-19 NOTE — Progress Notes (Signed)
 Received notification from pt's medicare that glyburide  was not on preferred formulary, so will switch from Glyburid 1.5 mg daily to glipizide  XR 2.5 mg daily. Continue normal follow up scheduled 01/24/2025. I have reached out to Dr. Koval to discuss at her mentioned f/u. Medication was sent to [facility] on [address].  Dr. Fairy Amy, MD [facility] Resident, PGY-1

## 2025-01-24 ENCOUNTER — Ambulatory Visit: Admitting: Pharmacist
